# Patient Record
Sex: Female | Born: 1960 | Race: White | Hispanic: No | Smoking: Current every day smoker
Health system: Southern US, Community
[De-identification: ages and names within clinical notes are randomized; demographics above are authoritative.]

## PROBLEM LIST (undated history)

## (undated) DIAGNOSIS — S161XXA Strain of muscle, fascia and tendon at neck level, initial encounter: Secondary | ICD-10-CM

## (undated) DIAGNOSIS — S0990XA Unspecified injury of head, initial encounter: Secondary | ICD-10-CM

## (undated) DIAGNOSIS — E876 Hypokalemia: Secondary | ICD-10-CM

## (undated) DIAGNOSIS — I10 Essential (primary) hypertension: Secondary | ICD-10-CM

## (undated) DIAGNOSIS — M79604 Pain in right leg: Secondary | ICD-10-CM

## (undated) DIAGNOSIS — E871 Hypo-osmolality and hyponatremia: Secondary | ICD-10-CM

## (undated) DIAGNOSIS — R112 Nausea with vomiting, unspecified: Secondary | ICD-10-CM

## (undated) DIAGNOSIS — R1030 Lower abdominal pain, unspecified: Secondary | ICD-10-CM

---

## 2001-04-08 ENCOUNTER — Other Ambulatory Visit: Admission: RE | Admit: 2001-04-08 | Discharge: 2001-04-08 | Payer: Self-pay | Admitting: Gynecology

## 2002-05-19 ENCOUNTER — Other Ambulatory Visit: Admission: RE | Admit: 2002-05-19 | Discharge: 2002-05-19 | Payer: Self-pay | Admitting: Gynecology

## 2003-10-05 ENCOUNTER — Other Ambulatory Visit: Admission: RE | Admit: 2003-10-05 | Discharge: 2003-10-05 | Payer: Self-pay | Admitting: Gynecology

## 2004-07-16 ENCOUNTER — Other Ambulatory Visit: Admission: RE | Admit: 2004-07-16 | Discharge: 2004-07-16 | Payer: Self-pay | Admitting: Gynecology

## 2004-11-08 ENCOUNTER — Other Ambulatory Visit: Admission: RE | Admit: 2004-11-08 | Discharge: 2004-11-08 | Payer: Self-pay | Admitting: Gynecology

## 2005-02-12 ENCOUNTER — Other Ambulatory Visit: Admission: RE | Admit: 2005-02-12 | Discharge: 2005-02-12 | Payer: Self-pay | Admitting: Gynecology

## 2005-02-22 ENCOUNTER — Encounter: Admission: RE | Admit: 2005-02-22 | Discharge: 2005-02-22 | Payer: Self-pay | Admitting: Orthopedic Surgery

## 2005-03-14 ENCOUNTER — Inpatient Hospital Stay (HOSPITAL_COMMUNITY): Admission: RE | Admit: 2005-03-14 | Discharge: 2005-03-16 | Payer: Self-pay | Admitting: Orthopedic Surgery

## 2018-08-24 ENCOUNTER — Encounter (HOSPITAL_COMMUNITY): Payer: Self-pay

## 2018-08-24 ENCOUNTER — Inpatient Hospital Stay (HOSPITAL_COMMUNITY)
Admission: EM | Admit: 2018-08-24 | Discharge: 2018-09-04 | DRG: 982 | Disposition: A | Discharge status: Skilled Nursing Facility | Payer: Self-pay | Attending: Internal Medicine | Admitting: Internal Medicine

## 2018-08-24 ENCOUNTER — Emergency Department (HOSPITAL_COMMUNITY): Payer: Self-pay

## 2018-08-24 ENCOUNTER — Other Ambulatory Visit: Payer: Self-pay

## 2018-08-24 DIAGNOSIS — R159 Full incontinence of feces: Secondary | ICD-10-CM | POA: Diagnosis not present

## 2018-08-24 DIAGNOSIS — M79604 Pain in right leg: Secondary | ICD-10-CM

## 2018-08-24 DIAGNOSIS — E872 Acidosis: Secondary | ICD-10-CM | POA: Diagnosis present

## 2018-08-24 DIAGNOSIS — Z808 Family history of malignant neoplasm of other organs or systems: Secondary | ICD-10-CM

## 2018-08-24 DIAGNOSIS — X58XXXA Exposure to other specified factors, initial encounter: Secondary | ICD-10-CM | POA: Diagnosis not present

## 2018-08-24 DIAGNOSIS — L8990 Pressure ulcer of unspecified site, unspecified stage: Secondary | ICD-10-CM

## 2018-08-24 DIAGNOSIS — I10 Essential (primary) hypertension: Secondary | ICD-10-CM

## 2018-08-24 DIAGNOSIS — L89151 Pressure ulcer of sacral region, stage 1: Secondary | ICD-10-CM | POA: Diagnosis present

## 2018-08-24 DIAGNOSIS — W19XXXA Unspecified fall, initial encounter: Secondary | ICD-10-CM

## 2018-08-24 DIAGNOSIS — R52 Pain, unspecified: Secondary | ICD-10-CM

## 2018-08-24 DIAGNOSIS — Z801 Family history of malignant neoplasm of trachea, bronchus and lung: Secondary | ICD-10-CM

## 2018-08-24 DIAGNOSIS — A084 Viral intestinal infection, unspecified: Secondary | ICD-10-CM | POA: Diagnosis present

## 2018-08-24 DIAGNOSIS — R9431 Abnormal electrocardiogram [ECG] [EKG]: Secondary | ICD-10-CM | POA: Diagnosis present

## 2018-08-24 DIAGNOSIS — E876 Hypokalemia: Secondary | ICD-10-CM

## 2018-08-24 DIAGNOSIS — I952 Hypotension due to drugs: Secondary | ICD-10-CM | POA: Diagnosis not present

## 2018-08-24 DIAGNOSIS — Z751 Person awaiting admission to adequate facility elsewhere: Secondary | ICD-10-CM

## 2018-08-24 DIAGNOSIS — Z884 Allergy status to anesthetic agent status: Secondary | ICD-10-CM

## 2018-08-24 DIAGNOSIS — G8918 Other acute postprocedural pain: Secondary | ICD-10-CM | POA: Diagnosis not present

## 2018-08-24 DIAGNOSIS — N838 Other noninflammatory disorders of ovary, fallopian tube and broad ligament: Secondary | ICD-10-CM

## 2018-08-24 DIAGNOSIS — K0889 Other specified disorders of teeth and supporting structures: Secondary | ICD-10-CM

## 2018-08-24 DIAGNOSIS — Z85828 Personal history of other malignant neoplasm of skin: Secondary | ICD-10-CM

## 2018-08-24 DIAGNOSIS — R7989 Other specified abnormal findings of blood chemistry: Secondary | ICD-10-CM

## 2018-08-24 DIAGNOSIS — S0990XA Unspecified injury of head, initial encounter: Secondary | ICD-10-CM

## 2018-08-24 DIAGNOSIS — G4700 Insomnia, unspecified: Secondary | ICD-10-CM | POA: Diagnosis not present

## 2018-08-24 DIAGNOSIS — F4325 Adjustment disorder with mixed disturbance of emotions and conduct: Secondary | ICD-10-CM | POA: Diagnosis present

## 2018-08-24 DIAGNOSIS — S8261XA Displaced fracture of lateral malleolus of right fibula, initial encounter for closed fracture: Secondary | ICD-10-CM

## 2018-08-24 DIAGNOSIS — R634 Abnormal weight loss: Secondary | ICD-10-CM | POA: Diagnosis present

## 2018-08-24 DIAGNOSIS — S93431A Sprain of tibiofibular ligament of right ankle, initial encounter: Secondary | ICD-10-CM | POA: Diagnosis present

## 2018-08-24 DIAGNOSIS — F17210 Nicotine dependence, cigarettes, uncomplicated: Secondary | ICD-10-CM | POA: Diagnosis present

## 2018-08-24 DIAGNOSIS — Z96651 Presence of right artificial knee joint: Secondary | ICD-10-CM | POA: Diagnosis present

## 2018-08-24 DIAGNOSIS — R410 Disorientation, unspecified: Secondary | ICD-10-CM

## 2018-08-24 DIAGNOSIS — R1030 Lower abdominal pain, unspecified: Secondary | ICD-10-CM

## 2018-08-24 DIAGNOSIS — E860 Dehydration: Secondary | ICD-10-CM | POA: Diagnosis present

## 2018-08-24 DIAGNOSIS — S82831A Other fracture of upper and lower end of right fibula, initial encounter for closed fracture: Secondary | ICD-10-CM

## 2018-08-24 DIAGNOSIS — W1830XA Fall on same level, unspecified, initial encounter: Secondary | ICD-10-CM | POA: Diagnosis present

## 2018-08-24 DIAGNOSIS — E878 Other disorders of electrolyte and fluid balance, not elsewhere classified: Secondary | ICD-10-CM

## 2018-08-24 DIAGNOSIS — G3184 Mild cognitive impairment, so stated: Secondary | ICD-10-CM | POA: Diagnosis present

## 2018-08-24 DIAGNOSIS — I959 Hypotension, unspecified: Secondary | ICD-10-CM

## 2018-08-24 DIAGNOSIS — E871 Hypo-osmolality and hyponatremia: Principal | ICD-10-CM

## 2018-08-24 DIAGNOSIS — R112 Nausea with vomiting, unspecified: Secondary | ICD-10-CM

## 2018-08-24 DIAGNOSIS — N179 Acute kidney failure, unspecified: Secondary | ICD-10-CM | POA: Diagnosis present

## 2018-08-24 DIAGNOSIS — R456 Violent behavior: Secondary | ICD-10-CM | POA: Diagnosis not present

## 2018-08-24 DIAGNOSIS — F329 Major depressive disorder, single episode, unspecified: Secondary | ICD-10-CM

## 2018-08-24 DIAGNOSIS — Z85820 Personal history of malignant melanoma of skin: Secondary | ICD-10-CM

## 2018-08-24 DIAGNOSIS — Y92009 Unspecified place in unspecified non-institutional (private) residence as the place of occurrence of the external cause: Secondary | ICD-10-CM

## 2018-08-24 DIAGNOSIS — Z79899 Other long term (current) drug therapy: Secondary | ICD-10-CM

## 2018-08-24 DIAGNOSIS — F411 Generalized anxiety disorder: Secondary | ICD-10-CM

## 2018-08-24 DIAGNOSIS — E861 Hypovolemia: Secondary | ICD-10-CM | POA: Diagnosis present

## 2018-08-24 DIAGNOSIS — M810 Age-related osteoporosis without current pathological fracture: Secondary | ICD-10-CM | POA: Diagnosis present

## 2018-08-24 DIAGNOSIS — S161XXA Strain of muscle, fascia and tendon at neck level, initial encounter: Secondary | ICD-10-CM

## 2018-08-24 DIAGNOSIS — M1990 Unspecified osteoarthritis, unspecified site: Secondary | ICD-10-CM

## 2018-08-24 DIAGNOSIS — T424X5A Adverse effect of benzodiazepines, initial encounter: Secondary | ICD-10-CM | POA: Diagnosis not present

## 2018-08-24 HISTORY — DX: Strain of muscle, fascia and tendon at neck level, initial encounter: S16.1XXA

## 2018-08-24 HISTORY — DX: Hypo-osmolality and hyponatremia: E87.1

## 2018-08-24 HISTORY — DX: Lower abdominal pain, unspecified: R10.30

## 2018-08-24 HISTORY — DX: Hypokalemia: E87.6

## 2018-08-24 HISTORY — DX: Essential (primary) hypertension: I10

## 2018-08-24 HISTORY — DX: Nausea with vomiting, unspecified: R11.2

## 2018-08-24 HISTORY — DX: Unspecified injury of head, initial encounter: S09.90XA

## 2018-08-24 HISTORY — DX: Pain in right leg: M79.604

## 2018-08-24 LAB — MRSA PCR SCREENING: MRSA BY PCR: NEGATIVE

## 2018-08-24 LAB — CBC
HCT: 46.3 % — ABNORMAL HIGH (ref 36.0–46.0)
HEMATOCRIT: 35 % — AB (ref 36.0–46.0)
Hemoglobin: 17.1 g/dL — ABNORMAL HIGH (ref 12.0–15.0)
Hemoglobin: 13.2 g/dL (ref 12.0–15.0)
MCH: 35.3 pg — ABNORMAL HIGH (ref 26.0–34.0)
MCH: 35.6 pg — ABNORMAL HIGH (ref 26.0–34.0)
MCHC: 36.9 g/dL — AB (ref 30.0–36.0)
MCHC: 37.7 g/dL — ABNORMAL HIGH (ref 30.0–36.0)
MCV: 95.7 fL (ref 78.0–100.0)
MCV: 94.3 fL (ref 78.0–100.0)
PLATELETS: 337 10*3/uL (ref 150–400)
PLATELETS: 213 10*3/uL (ref 150–400)
RBC: 4.84 MIL/uL (ref 3.87–5.11)
RBC: 3.71 MIL/uL — ABNORMAL LOW (ref 3.87–5.11)
RDW: 11.3 % — AB (ref 11.5–15.5)
RDW: 11.3 % — AB (ref 11.5–15.5)
WBC: 10.1 10*3/uL (ref 4.0–10.5)
WBC: 6.4 10*3/uL (ref 4.0–10.5)

## 2018-08-24 LAB — COMPREHENSIVE METABOLIC PANEL
ALBUMIN: 4.2 g/dL (ref 3.5–5.0)
ALK PHOS: 79 U/L (ref 38–126)
ALT: 40 U/L (ref 0–44)
ALT: 33 U/L (ref 0–44)
ANION GAP: 24 — AB (ref 5–15)
AST: 47 U/L — AB (ref 15–41)
AST: 34 U/L (ref 15–41)
Albumin: 3.5 g/dL (ref 3.5–5.0)
Alkaline Phosphatase: 70 U/L (ref 38–126)
BILIRUBIN TOTAL: 3.3 mg/dL — AB (ref 0.3–1.2)
BUN: 24 mg/dL — AB (ref 6–20)
BUN: 21 mg/dL — AB (ref 6–20)
CHLORIDE: 68 mmol/L — AB (ref 98–111)
CO2: 29 mmol/L (ref 22–32)
CO2: 32 mmol/L (ref 22–32)
CREATININE: 1.57 mg/dL — AB (ref 0.44–1.00)
Calcium: 10.1 mg/dL (ref 8.9–10.3)
Calcium: 9 mg/dL (ref 8.9–10.3)
Chloride: <65 mmol/L — CL (ref 98–111)
Creatinine, Ser: 1.27 mg/dL — ABNORMAL HIGH (ref 0.44–1.00)
GFR calc Af Amer: 41 mL/min — ABNORMAL LOW (ref >60)
GFR, EST AFRICAN AMERICAN: 53 mL/min — AB (ref >60)
GFR, EST NON AFRICAN AMERICAN: 36 mL/min — AB (ref >60)
GFR, EST NON AFRICAN AMERICAN: 46 mL/min — AB (ref >60)
GLUCOSE: 109 mg/dL — AB (ref 70–99)
Glucose, Bld: 85 mg/dL (ref 70–99)
Potassium: 2.2 mmol/L — CL (ref 3.5–5.1)
Potassium: 2.3 mmol/L — CL (ref 3.5–5.1)
Sodium: 122 mmol/L — ABNORMAL LOW (ref 135–145)
Sodium: 124 mmol/L — ABNORMAL LOW (ref 135–145)
TOTAL PROTEIN: 7.8 g/dL (ref 6.5–8.1)
TOTAL PROTEIN: 6.6 g/dL (ref 6.5–8.1)
Total Bilirubin: 3.7 mg/dL — ABNORMAL HIGH (ref 0.3–1.2)

## 2018-08-24 LAB — BASIC METABOLIC PANEL
Anion gap: 24 — ABNORMAL HIGH (ref 5–15)
BUN: 20 mg/dL (ref 6–20)
CO2: 31 mmol/L (ref 22–32)
Calcium: 8.9 mg/dL (ref 8.9–10.3)
Chloride: 70 mmol/L — ABNORMAL LOW (ref 98–111)
Creatinine, Ser: 1.14 mg/dL — ABNORMAL HIGH (ref 0.44–1.00)
GFR, EST NON AFRICAN AMERICAN: 52 mL/min — AB (ref >60)
Glucose, Bld: 75 mg/dL (ref 70–99)
Potassium: 2.4 mmol/L — CL (ref 3.5–5.1)
SODIUM: 125 mmol/L — AB (ref 135–145)

## 2018-08-24 LAB — I-STAT BETA HCG BLOOD, ED (MC, WL, AP ONLY): I-stat hCG, quantitative: 15.4 m[IU]/mL — ABNORMAL HIGH (ref <5)

## 2018-08-24 LAB — LIPASE, BLOOD: Lipase: 25 U/L (ref 11–51)

## 2018-08-24 LAB — MAGNESIUM: MAGNESIUM: 2.3 mg/dL (ref 1.7–2.4)

## 2018-08-24 MED ORDER — ONDANSETRON 4 MG PO TBDP: 4.0000 mg | ORAL_TABLET | Freq: Once | ORAL | Status: DC | PRN

## 2018-08-24 MED ORDER — IOHEXOL 300 MG/ML  SOLN
100.0000 mL | Freq: Once | INTRAMUSCULAR | Status: AC | PRN
  Administered 2018-08-24: 75 mL via INTRAVENOUS

## 2018-08-24 MED ORDER — METOCLOPRAMIDE HCL 5 MG/ML IJ SOLN
5.0000 mg | Freq: Once | INTRAMUSCULAR | Status: AC
  Administered 2018-08-24: 5 mg via INTRAVENOUS
  Filled 2018-08-24: qty 2

## 2018-08-24 MED ORDER — SODIUM CHLORIDE 0.9 % IV SOLN
INTRAVENOUS | Status: DC
  Administered 2018-08-24 – 2018-08-26 (×4): via INTRAVENOUS

## 2018-08-24 MED ORDER — LACTATED RINGERS IV BOLUS
500.0000 mL | Freq: Once | INTRAVENOUS | Status: AC
  Administered 2018-08-24: 500 mL via INTRAVENOUS

## 2018-08-24 MED ORDER — SODIUM CHLORIDE 0.9 % IV SOLN: 250.0000 mL | INTRAVENOUS | Status: DC | PRN

## 2018-08-24 MED ORDER — SODIUM CHLORIDE 0.9% FLUSH: 3.0000 mL | INTRAVENOUS | Status: DC | PRN

## 2018-08-24 MED ORDER — POTASSIUM CHLORIDE 10 MEQ/100ML IV SOLN
10.0000 meq | INTRAVENOUS | Status: AC
  Administered 2018-08-25 (×3): 10 meq via INTRAVENOUS
  Filled 2018-08-24 (×4): qty 100

## 2018-08-24 MED ORDER — POTASSIUM CHLORIDE CRYS ER 20 MEQ PO TBCR
40.0000 meq | EXTENDED_RELEASE_TABLET | Freq: Every day | ORAL | Status: DC
  Administered 2018-08-24: 40 meq via ORAL
  Filled 2018-08-24: qty 2

## 2018-08-24 MED ORDER — SODIUM CHLORIDE 0.9% FLUSH: 3.0000 mL | Freq: Two times a day (BID) | INTRAVENOUS | Status: DC

## 2018-08-24 MED ORDER — MORPHINE SULFATE (PF) 4 MG/ML IV SOLN
4.0000 mg | Freq: Once | INTRAVENOUS | Status: AC
  Administered 2018-08-24: 4 mg via INTRAVENOUS
  Filled 2018-08-24: qty 1

## 2018-08-24 MED ORDER — POTASSIUM CHLORIDE 10 MEQ/100ML IV SOLN
10.0000 meq | INTRAVENOUS | Status: AC
  Administered 2018-08-24 (×6): 10 meq via INTRAVENOUS
  Filled 2018-08-24 (×6): qty 100

## 2018-08-24 MED ORDER — SODIUM CHLORIDE 0.9 % IV BOLUS
500.0000 mL | Freq: Once | INTRAVENOUS | Status: AC
  Administered 2018-08-24: 500 mL via INTRAVENOUS

## 2018-08-24 MED ORDER — ONDANSETRON HCL 4 MG/2ML IJ SOLN: 4.0000 mg | Freq: Once | INTRAMUSCULAR | Status: DC

## 2018-08-24 MED ORDER — ENOXAPARIN SODIUM 40 MG/0.4ML ~~LOC~~ SOLN
40.0000 mg | SUBCUTANEOUS | Status: DC
  Administered 2018-08-24 – 2018-08-26 (×3): 40 mg via SUBCUTANEOUS
  Filled 2018-08-24 (×3): qty 0.4

## 2018-08-24 MED ORDER — INFLUENZA VAC SPLIT QUAD 0.5 ML IM SUSY
0.5000 mL | PREFILLED_SYRINGE | INTRAMUSCULAR | Status: DC
  Filled 2018-08-24: qty 0.5

## 2018-08-24 MED ORDER — ACETAMINOPHEN 325 MG PO TABS
650.0000 mg | ORAL_TABLET | Freq: Four times a day (QID) | ORAL | Status: DC | PRN
  Administered 2018-08-24 – 2018-08-25 (×2): 650 mg via ORAL
  Filled 2018-08-24 (×2): qty 2

## 2018-08-24 NOTE — ED Notes (Signed)
Patient transported to CT 

## 2018-08-24 NOTE — Progress Notes (Signed)
Paged for patient being hypotensive with MAP of 57 although she has been asymptomatic. HR 81, 99% O2 RA. On visiting patient at bedside she denies acute symptoms of chills, feeling feversih, dizziness, nausea, dysuria, SOB or recent cough and is feeling much better compared to earlier today. She states earlier in the day she was urinating a lot but has not urinated in the past three hours. She has a purewick in place. She states she is hungry but taking it slow in eating.   Plan:  - strict I/O's - bladder scan  - 500cc bolus LR    Seawell, Jaimie A, DO 08/24/2018, 10:28 PM Pager: [phone]

## 2018-08-24 NOTE — ED Notes (Signed)
Sonya Cohen (ex husband)  Contact info: [phone]  * call for any changes*

## 2018-08-24 NOTE — ED Provider Notes (Signed)
Emergency Department Provider Note   I have reviewed the triage vital signs and the nursing notes.   HISTORY  Chief Complaint Fall; Hip Pain; Ankle Pain; and Diarrhea   HPI Sonya Cohen is a 57 y.o. female with PMH of HTN [place] to the emergency department with multiple complaints including the right lower extremity pain after fall this morning.  She has had nausea and vomiting for the past 5 days.  Is developed generalized weakness which she believes led to her fall today.  She is also experiencing some generalized abdominal discomfort which is slightly worse on the right side.  Her fall today may have involved some head injury according to the patient.  Loss of consciousness.  No chest pain or shortness of breath prior to falling or afterwards.  No heart palpitations symptoms.  Denies any new medications.    Past Medical History:  Diagnosis Date  . Hypertension     Patient Active Problem List   Diagnosis Date Noted  . Hyponatremia 08/24/2018  . Hypokalemia   . Non-intractable vomiting with nausea   . Lower abdominal pain   . Closed fracture of right distal fibula   . Right leg pain   . Head injury   . Cervical strain     History reviewed. No pertinent surgical history.    Allergies Anesthetics, ester  History reviewed. No pertinent family history.  Social History Social History   Tobacco Use  . Smoking status: Never Smoker  . Smokeless tobacco: Never Used  Substance Use Topics  . Alcohol use: Never    Frequency: Never  . Drug use: Never    Review of Systems  Constitutional: No fever/chills. Positive generalized weakness.  Eyes: No visual changes. ENT: No sore throat. Cardiovascular: Denies chest pain. Respiratory: Denies shortness of breath. Gastrointestinal: Positive abdominal pain. Positive nausea and vomiting.  No diarrhea.  No constipation. Genitourinary: Negative for dysuria. Musculoskeletal: Positive right hip, knee, and ankle pain.    Skin: Negative for rash. Neurological: Negative for headaches, focal weakness or numbness.  10-point ROS otherwise negative.  ____________________________________________   PHYSICAL EXAM:  VITAL SIGNS: ED Triage Vitals  Enc Vitals Group     BP 08/24/18 1113 95/60     Pulse Rate 08/24/18 1113 78     Resp 08/24/18 1113 (!) 22     Temp 08/24/18 1113 98.6 F (37 C)     Temp Source 08/24/18 1113 Oral     SpO2 08/24/18 1110 100 %     Weight 08/24/18 1225 140 lb (63.5 kg)     Height 08/24/18 1225 5\' 2"  (1.575 m)     Pain Score 08/24/18 1111 7   Constitutional: Alert and oriented. Well appearing and in no acute distress. Eyes: Conjunctivae are normal. PERRL. Head: Atraumatic. Nose: No congestion/rhinnorhea. Mouth/Throat: Mucous membranes are dry.  Neck: No stridor. No cervical spine tenderness to palpation. Cardiovascular: Normal rate, regular rhythm. Good peripheral circulation. Grossly normal heart sounds.   Respiratory: Normal respiratory effort.  No retractions. Lungs CTAB. Gastrointestinal: Soft with mild diffuse tenderness worse in the lower abdomen. No distention.  Musculoskeletal: Right hip, knee, and ankle tenderness to passive ROM. Bruising and swelling over the right ankle. Palpable DP/PT pulses. Normal sensation in the LEs.  Neurologic:  Normal speech and language. No gross focal neurologic deficits are appreciated.  Skin:  Skin is warm, dry and intact. No rash noted.   ____________________________________________   LABS (all labs ordered are listed, but only abnormal results are  displayed)  Labs Reviewed  COMPREHENSIVE METABOLIC PANEL - Abnormal; Notable for the following components:      Result Value   Sodium 122 (*)    Potassium 2.2 (*)    Chloride <65 (*)    Glucose, Bld 109 (*)    BUN 24 (*)    Creatinine, Ser 1.57 (*)    AST 47 (*)    Total Bilirubin 3.7 (*)    GFR calc non Af Amer 36 (*)    GFR calc Af Amer 41 (*)    All other components within  normal limits  CBC - Abnormal; Notable for the following components:   Hemoglobin 17.1 (*)    HCT 46.3 (*)    MCH 35.3 (*)    MCHC 36.9 (*)    RDW 11.3 (*)    All other components within normal limits  COMPREHENSIVE METABOLIC PANEL - Abnormal; Notable for the following components:   Sodium 124 (*)    Potassium 2.3 (*)    Chloride 68 (*)    BUN 21 (*)    Creatinine, Ser 1.27 (*)    Total Bilirubin 3.3 (*)    GFR calc non Af Amer 46 (*)    GFR calc Af Amer 53 (*)    Anion gap 24 (*)    All other components within normal limits  I-STAT BETA HCG BLOOD, ED ([facility], [facility], [facility] ONLY) - Abnormal; Notable for the following components:   I-stat hCG, quantitative 15.4 (*)    All other components within normal limits  MRSA PCR SCREENING  LIPASE, BLOOD  MAGNESIUM  URINALYSIS, ROUTINE W REFLEX MICROSCOPIC  HIV ANTIBODY (ROUTINE TESTING W REFLEX)  CBC  BASIC METABOLIC PANEL  BASIC METABOLIC PANEL  BASIC METABOLIC PANEL  LACTATE DEHYDROGENASE  AFP TUMOR MARKER  BASIC METABOLIC PANEL   ____________________________________________  EKG   EKG Interpretation  Date/Time:  Monday August 24 2018 11:22:30 EDT Ventricular Rate:  95 PR Interval:  148 QRS Duration: 90 QT Interval:  432 QTC Calculation: 542 R Axis:   40 Text Interpretation:  Normal sinus rhythm Right atrial enlargement Possible Lateral infarct , age undetermined Inferior infarct , age undetermined Prolonged QT Abnormal ECG No STEMI.  Confirmed by Alona BeneLong, Joshua [phone]([id number]) on 08/24/2018 12:51:40 PM       ____________________________________________  RADIOLOGY  Dg Knee 2 Views Right  Result Date: 08/24/2018 CLINICAL DATA:  Fall 2 days ago, RIGHT hip and ankle pain. EXAM: RIGHT KNEE - 1-2 VIEW COMPARISON:  Plain film of the RIGHT knee dated 03/26/2018. FINDINGS: RIGHT knee arthroplasty hardware appears intact and stable in position. No osseous fracture line or acutely displaced fracture fragment seen. No appreciable joint effusion  and adjacent soft tissues are unremarkable. IMPRESSION: No acute findings. RIGHT knee arthroplasty hardware appears intact and stable in position. Electronically Signed   By: Bary RichardStan  Maynard M.D.   On: 08/24/2018 14:41   Dg Ankle Complete Right  Result Date: 08/24/2018 CLINICAL DATA:  Fall 2 days ago, RIGHT hip and ankle pain. EXAM: RIGHT ANKLE - COMPLETE 3+ VIEW COMPARISON:  None. FINDINGS: Slightly displaced obliquely oriented fracture of the distal RIGHT fibula, with approximately 3 mm cortical displacement. Associated soft tissue swelling about the RIGHT ankle, most prominent over the lateral malleolus. Distal tibia appears intact and normally aligned. Ankle mortise appeared grossly symmetric on the oblique view. Visualized portions of the hindfoot and midfoot appear intact. IMPRESSION: Slightly displaced oblique fracture of the distal RIGHT fibula, with associated soft tissue swelling. Electronically Signed  By: Bary Richard M.D.   On: 08/24/2018 14:44   Ct Head Wo Contrast  Result Date: 08/24/2018 CLINICAL DATA:  Fall 2 days ago with head and neck pain. EXAM: CT HEAD WITHOUT CONTRAST CT CERVICAL SPINE WITHOUT CONTRAST TECHNIQUE: Multidetector CT imaging of the head and cervical spine was performed following the standard protocol without intravenous contrast. Multiplanar CT image reconstructions of the cervical spine were also generated. COMPARISON:  None. FINDINGS: CT HEAD FINDINGS Brain: No evidence of acute infarction, hemorrhage, hydrocephalus, extra-axial collection or mass lesion/mass effect. Vascular: No hyperdense vessel or unexpected calcification. Skull: Normal. Negative for fracture or focal lesion. Sinuses/Orbits: Orbits are normal. Paranasal sinuses are well developed as there is minimal opacification over the ethmoid air cells. Mastoid air cells are clear. Other: None. CT CERVICAL SPINE FINDINGS Alignment: Within normal. Skull base and vertebrae: Non fusion of the posterior arch of C1.  Mild spondylosis throughout the cervical spine. Anterior fusion hardware intact from C5-C7. Uncovertebral joint spurring and facet arthropathy. Partial fusion of the C3-4 facet joints. No foraminal narrowing at multiple levels due to adjacent bony spurring. No acute fracture or subluxation. Soft tissues and spinal canal: No prevertebral fluid or swelling. No visible canal hematoma. Disc levels:  Subtle disc space narrowing at the C3-4 C4-5 levels. Upper chest: No acute findings. Other: None. IMPRESSION: No acute brain injury. No acute cervical spine injury. Mild spondylosis of the cervical spine with minimal disc disease at the C3-4 and C4-5 levels. Moderate multilevel bilateral neural foraminal narrowing due to adjacent bony spurring. Anterior fusion hardware intact from C5-C7. Subtle chronic sinus inflammatory change over the ethmoid air cells. Electronically Signed   By: Elberta Fortis M.D.   On: 08/24/2018 14:29   Ct Cervical Spine Wo Contrast  Result Date: 08/24/2018 CLINICAL DATA:  Fall 2 days ago with head and neck pain. EXAM: CT HEAD WITHOUT CONTRAST CT CERVICAL SPINE WITHOUT CONTRAST TECHNIQUE: Multidetector CT imaging of the head and cervical spine was performed following the standard protocol without intravenous contrast. Multiplanar CT image reconstructions of the cervical spine were also generated. COMPARISON:  None. FINDINGS: CT HEAD FINDINGS Brain: No evidence of acute infarction, hemorrhage, hydrocephalus, extra-axial collection or mass lesion/mass effect. Vascular: No hyperdense vessel or unexpected calcification. Skull: Normal. Negative for fracture or focal lesion. Sinuses/Orbits: Orbits are normal. Paranasal sinuses are well developed as there is minimal opacification over the ethmoid air cells. Mastoid air cells are clear. Other: None. CT CERVICAL SPINE FINDINGS Alignment: Within normal. Skull base and vertebrae: Non fusion of the posterior arch of C1. Mild spondylosis throughout the cervical  spine. Anterior fusion hardware intact from C5-C7. Uncovertebral joint spurring and facet arthropathy. Partial fusion of the C3-4 facet joints. No foraminal narrowing at multiple levels due to adjacent bony spurring. No acute fracture or subluxation. Soft tissues and spinal canal: No prevertebral fluid or swelling. No visible canal hematoma. Disc levels:  Subtle disc space narrowing at the C3-4 C4-5 levels. Upper chest: No acute findings. Other: None. IMPRESSION: No acute brain injury. No acute cervical spine injury. Mild spondylosis of the cervical spine with minimal disc disease at the C3-4 and C4-5 levels. Moderate multilevel bilateral neural foraminal narrowing due to adjacent bony spurring. Anterior fusion hardware intact from C5-C7. Subtle chronic sinus inflammatory change over the ethmoid air cells. Electronically Signed   By: Elberta Fortis M.D.   On: 08/24/2018 14:29   Ct Abdomen Pelvis W Contrast  Result Date: 08/24/2018 CLINICAL DATA:  Nausea,vomiting and diarrhea With generalized  weakness for 2 days. Reduced dose. EXAM: CT ABDOMEN AND PELVIS WITH CONTRAST TECHNIQUE: Multidetector CT imaging of the abdomen and pelvis was performed using the standard protocol following bolus administration of intravenous contrast. CONTRAST:  75mL OMNIPAQUE IOHEXOL 300 MG/ML  SOLN COMPARISON:  None. FINDINGS: Lower chest: Lung bases are clear. Hepatobiliary: Gallbladder is distended. Otherwise gallbladder is normal in appearance. Liver is homogeneous without focal mass. Pancreas: Unremarkable. No pancreatic ductal dilatation or surrounding inflammatory changes. Spleen: Normal in size without focal abnormality. Adrenals/Urinary Tract: Adrenal glands are unremarkable. Kidneys are normal, without renal calculi, focal lesion, or hydronephrosis. Bladder is unremarkable. Stomach/Bowel: Small hiatal hernia. The stomach is otherwise normal in appearance. Normal appearance of small bowel loops. There is fat deposition within the  wall of the ascending and proximal transverse colon consistent with chronic inflammatory change. The appendix is well seen and has a normal appearance. Vascular/Lymphatic: There is atherosclerotic calcification of the abdominal aorta not associated with aneurysm. Reproductive: Uterus is present. Coarse calcification is identified in the RIGHT adnexal region, raising the question of a possible dermoid. Ovary is not enlarged. Other: No abdominal wall hernia or abnormality. No abdominopelvic ascites. Musculoskeletal: Superior endplate fracture of L3 is favored to be chronic. No suspicious lytic or blastic lesions. IMPRESSION: 1. Fat deposition within the wall of the ascending and proximal transverse colon is consistent with chronic inflammatory changes. No evidence for acute inflammation/obstruction. 2. 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 3. Small hiatal hernia. 4. Normal appendix. 5. Chronic superior endplate fracture of L3. Electronically Signed   By: Norva Pavlov M.D.   On: 08/24/2018 14:31   Dg Hip Unilat W Or Wo Pelvis 2-3 Views Right  Result Date: 08/24/2018 CLINICAL DATA:  Mechanical fall 2 days ago, RIGHT hip and ankle pain since. EXAM: DG HIP (WITH OR WITHOUT PELVIS) 2-3V RIGHT COMPARISON:  None. FINDINGS: There is no evidence of hip fracture or dislocation. There is no evidence of arthropathy or other focal bone abnormality. IMPRESSION: Negative. Electronically Signed   By: Bary Richard M.D.   On: 08/24/2018 14:40    ____________________________________________   PROCEDURES  Procedure(s) performed:   .Critical Care Performed by: Maia Plan, MD Authorized by: Maia Plan, MD   Critical care provider statement:    Critical care time (minutes):  35   Critical care time was exclusive of:  Separately billable procedures and treating other patients and teaching time   Critical care was necessary to treat or prevent imminent or  life-threatening deterioration of the following conditions:  Dehydration and metabolic crisis   Critical care was time spent personally by me on the following activities:  Blood draw for specimens, development of treatment plan with patient or surrogate, evaluation of patient's response to treatment, examination of patient, obtaining history from patient or surrogate, ordering and performing treatments and interventions, ordering and review of laboratory studies, ordering and review of radiographic studies, pulse oximetry, re-evaluation of patient's condition and review of old charts   I assumed direction of critical care for this patient from another provider in my specialty: no       ____________________________________________   INITIAL IMPRESSION / ASSESSMENT AND PLAN / ED COURSE  Pertinent labs & imaging results that were available during my care of the patient were reviewed by me and considered in my medical decision making (see chart for details).  Presents to the emergency department with 5 days of worsening generalized weakness in the setting of nausea and  vomiting.  She has abdominal pain with tenderness worse in the lower quadrants.  No peritoneal findings.  Patient's weakness caused her to have a fall today which have resulted in pain in the right leg.  No midline thoracic or lumbar tenderness.  The lower extremities are neurovascularly intact.  Plan for plain films of the right hip, knee, ankle.  Also plan for CT imaging of the head, cervical spine, abdomen.  The patient has significant hypokalemia with only slightly prolonged QT on EKG.  Plan for replacement of this.  Patient also with significant hyponatremia.  Plan for normal saline at 150/h and will follow imaging.  Does note says the patient had a fall 2 days ago but she reports this fall was this morning to me.   Patient with distal fibular fracture. Paged ortho but no response. Will defer to primary team. Patient with significant  hypokalemia and hyponatremia. Plan for admit for further treatment and evaluation. CT imaging reviewed with no acute findings.   Discussed patient's case with Medicine Service to request admission. Patient and family (if present) updated with plan. Care transferred to Medicine Service.  I reviewed all nursing notes, vitals, pertinent old records, EKGs, labs, imaging (as available).  ____________________________________________  FINAL CLINICAL IMPRESSION(S) / ED DIAGNOSES  Final diagnoses:  Hyponatremia  Hypokalemia  Non-intractable vomiting with nausea, unspecified vomiting type  Lower abdominal pain  Closed fracture of distal end of right fibula, unspecified fracture morphology, initial encounter  Right leg pain  Injury of head, initial encounter  Strain of neck muscle, initial encounter     MEDICATIONS GIVEN DURING THIS VISIT:  Medications  0.9 %  sodium chloride infusion ( Intravenous Rate/Dose Change 08/24/18 1842)  enoxaparin (LOVENOX) injection 40 mg (40 mg Subcutaneous Given 08/24/18 2011)  Influenza vac split quadrivalent PF (FLUARIX) injection 0.5 mL (has no administration in time range)  potassium chloride SA (K-DUR,KLOR-CON) CR tablet 40 mEq (40 mEq Oral Given 08/24/18 2200)  acetaminophen (TYLENOL) tablet 650 mg (650 mg Oral Given 08/24/18 2159)  lactated ringers bolus 500 mL (has no administration in time range)  potassium chloride 10 mEq in 100 mL IVPB (10 mEq Intravenous New Bag/Given 08/24/18 2010)  iohexol (OMNIPAQUE) 300 MG/ML solution 100 mL (75 mLs Intravenous Contrast Given 08/24/18 1357)  morphine 4 MG/ML injection 4 mg (4 mg Intravenous Given 08/24/18 1453)  metoCLOPramide (REGLAN) injection 5 mg (5 mg Intravenous Given 08/24/18 1503)  sodium chloride 0.9 % bolus 500 mL (0 mLs Intravenous Stopped 08/24/18 1732)    Note:  This document was prepared using Dragon voice recognition software and may include unintentional dictation errors.  Alona Bene, MD Emergency  Medicine    Long, Arlyss Repress, MD 08/25/18 1100

## 2018-08-24 NOTE — ED Triage Notes (Signed)
Pt endorses mechanical fall 2 days ago and complains of right hip and ankle pain since. Also endorses n/v/d with generalized weakness for several days. VSS.

## 2018-08-24 NOTE — ED Notes (Signed)
Ortho will be delayed in splinting right ankle.

## 2018-08-24 NOTE — H&P (Addendum)
Date: 08/24/2018               Patient Name:  Sonya Cohen MRN: [id number]  DOB: 04/24/1961 Age / Sex: 57 y.o., female   PCP: [facility]         Medical Service: Internal Medicine Teaching Service         Attending Physician: Dr. Burns Spain, MD    First Contact: Dr. Dortha Schwalbe Pager: [phone]  Second Contact: Dr. 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  She reports that about 1 week ago she experienced a sudden onset nausea and bilious emesis which has been ongoing. Last episode of emesis was in the ED consistent with bilious material. 3 days prior to the onset of symptoms she had left lower quadrant pain.  Initially pain was 6/10 but had gradually increased to 8/10 a day prior to admission.  Pain is non-radiating, constant and improved with water.  She also reports a one-time episode of diarrhea which has since resolved.  Since onset, she has experienced diaphoresis, felt lightheaded especially with standing which resulted in GLF 2 days ago with posterior head trauma.  She has also endorsed intermittent palpitation, lower extremity weakness, fatigue and mild shortness of breath but however denies chest pain, dysrhythmia, syncope, fevers, chills, vision abnormalities, headache.  She endorses good appetite and has been drinking about 6 bottles of water a day to keep hydrated while continuing to take her antihypertensive (HCTZ).  She also reports of a 60  pound weight loss in the past 3 months, unintentional but denies night sweats.  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 Pap smear in 2016 reviewed low-grade squamous intraepithelial lesions, mild dysplasia.  ED course: BP 95/60 (SBP range 83-161), RR 22, pulse 78, afebrile, SPO2 100% on ambient air.  EKG revealed normal sinus rhythm, CT head and cervical spine reviewed no acute brain injury, CT abdomen and pelvis revealed chronic inflammatory changes of the colon, coarse calcification within the right ovary, x-ray right ankle revealed displaced oblique fracture of distal right fibula with soft tissue swelling.  Labs were significant for sodium 122, potassium 2.2, chloride <65, mildly elevated AST at 47, elevated total bilirubin of 3.7, elevated hemoglobin of 17.7, elevated hCG at 15.4.  She was started on IV potassium chloride, normal saline infusion and received Reglan, morphine in the ED.    Meds: Patient reports that she only takes hydrochlorothiazide 25 mg daily and hydrocodone.  No outpatient medications have been marked as taking for the 08/24/18 encounter [facility] Encounter).     Allergies: Allergies as of 08/24/2018 - Review Complete 08/24/2018  Allergen Reaction Noted  . Anesthetics, ester Nausea And Vomiting 02/17/2015   Past Medical History:  Diagnosis Date  . Hypertension     Family History: Father deceased with brain and lung cancer.  She also reports of family history of skin cancer.  Social History:  -Current cigarette smoker.  Smokes about 1 pack/day for 30 years. -  Current EtOH use.  Drinks 1 mini Budweiser per day for 20 years. -Denies illicit or IV drug use  Review of Systems: A complete ROS was negative except as per HPI.    Physical Exam: Blood pressure 131/77, pulse 78, temperature 98.6 F (37 C), temperature source Oral, resp. rate 13, height 5\' 2"  (1.575 m), weight 63.5 kg, SpO2 100  %.  Physical Exam  Constitutional: She is oriented to person, place, and time and well-developed, well-nourished, and in no distress. No distress.  HENT:  Head: Normocephalic and atraumatic.  Mouth/Throat: Mucous membranes are pale, dry and not cyanotic. She does not have dentures. No oral lesions. Abnormal dentition. No dental abscesses or lacerations.  Cardiovascular: Normal rate, regular rhythm, normal heart sounds and intact distal pulses. Exam reveals no gallop and no friction rub.  No murmur heard. Pulmonary/Chest: Effort normal and breath sounds normal. No respiratory distress. She has no wheezes. She has no rales.  Abdominal: Soft. Bowel sounds are normal. She exhibits no distension. There is no tenderness. There is no rebound and no guarding.  Neurological: She is alert and oriented to person, place, and time. No cranial nerve deficit. GCS score is 15.  Skin: Skin is warm and dry. No rash noted. She is not diaphoretic.  Noticeable actinic lesions on the anterior chest.  Psychiatric: Mood and affect normal.    EKG: personally reviewed my interpretation normal sinus with right atrial enlargement   Assessment & Plan by Problem: Active Problems:   Hyponatremia  Ms. 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  Hypotonic hypovolemic Hyponatremia Hypo-kalemia, hypochloremia Presents with 1 week history of acute onset nausea with bilious emesis.  Also reported of a one-time episode of diarrhea with no associated fevers or chills.  Since onset, she has continued taking hydrochlorothiazide 20 mg daily while trying to compensate for dehydration by drinking 6 bottles of water a day.  On arrival to the ED, she was  hypotensive with SBP in the 80s-90s.  CMP significant for sNa of 122, K+ 2.2, Cl <65. Her calculated sOsm was 259.  Unable to calculate anion gap due to significantly low serum chloride.  On physical exam, she was alert and oriented to place, person and time, mucous membrane is dry, decreased skin turgor was noted.  -Continue NS at 100 mL/hour (goal sNa 126-128 @11pm  08/24/18) -Continuous cardiac monitoring -BMP every 4 hours -Follow-up a.m. CMP, Mg  Hypertension: Hold home hydrochlorothiazide 20 mg daily given low BPs.  Right ovarian calcification: Presented with 1 week history of left lower quadrant pain that had resolved after receiving morphine in the ED.  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  In addition her hCG was markedly elevated at 15.4.  On physical exam, she did not endorse abdominal tenderness on palpation.  She does have several risk factors for malignancy including a long-standing smoking and alcohol use.  -Follow-up a.m. AFP, LDH  Hemo-concentration/?Polycythemia: CBC noticeable for elevated hemoglobin at 17.1, decrease RDW.  This is most likely due to hemo-concentration from dehydration and will follow up on a.m. CBC. -Follow-up a.m. CBC  Ground-level fall Right distal fibula fracture She experienced a ground-level fall 2 days ago as a result of generalized weakness from ongoing nausea, vomiting, diuretic use.  X-ray  right ankle revealed displaced oblique fracture of distal right fibula with soft tissue swelling.  A stirrup splint of right ankle was placed in the ED. -Will monitor for pain and treat appropriately.   FEN: N.p.o., replace electrolytes as needed, normal saline at 100 mL/hour VTE prophylaxis: Lovenox 40 mg SQ CODE STATUS: Full code  Dispo: Admit patient to inpatient with expected length of stay greater than 2 midnights.    Signed: Yvette Rack, MD 08/24/2018, 5:53 PM  Pager:  [phone] IMTS PGY-1

## 2018-08-24 NOTE — Progress Notes (Signed)
Orthopedic Tech Progress Note Patient Details:  Sonya Cohen September 09, 1961 [id number]  Ortho Devices Type of Ortho Device: Ace wrap, Post (short leg) splint, Stirrup splint Ortho Device/Splint Location: rle Ortho Device/Splint Interventions: Application   Post Interventions Patient Tolerated: Well Instructions Provided: Care of device   [name], Raghad Lorenz 08/24/2018, 5:00 PM

## 2018-08-25 ENCOUNTER — Inpatient Hospital Stay (HOSPITAL_COMMUNITY): Payer: Self-pay

## 2018-08-25 ENCOUNTER — Encounter (HOSPITAL_COMMUNITY): Payer: Self-pay | Admitting: Internal Medicine

## 2018-08-25 DIAGNOSIS — R269 Unspecified abnormalities of gait and mobility: Secondary | ICD-10-CM

## 2018-08-25 DIAGNOSIS — S8261XD Displaced fracture of lateral malleolus of right fibula, subsequent encounter for closed fracture with routine healing: Secondary | ICD-10-CM

## 2018-08-25 DIAGNOSIS — E872 Acidosis: Secondary | ICD-10-CM

## 2018-08-25 DIAGNOSIS — Z96659 Presence of unspecified artificial knee joint: Secondary | ICD-10-CM

## 2018-08-25 DIAGNOSIS — E861 Hypovolemia: Secondary | ICD-10-CM

## 2018-08-25 DIAGNOSIS — W19XXXD Unspecified fall, subsequent encounter: Secondary | ICD-10-CM

## 2018-08-25 LAB — BASIC METABOLIC PANEL
Anion gap: 16 — ABNORMAL HIGH (ref 5–15)
Anion gap: 13 (ref 5–15)
Anion gap: 12 (ref 5–15)
BUN: 17 mg/dL (ref 6–20)
BUN: 15 mg/dL (ref 6–20)
BUN: 14 mg/dL (ref 6–20)
CHLORIDE: 77 mmol/L — AB (ref 98–111)
CHLORIDE: 81 mmol/L — AB (ref 98–111)
CHLORIDE: 84 mmol/L — AB (ref 98–111)
CO2: 33 mmol/L — ABNORMAL HIGH (ref 22–32)
CO2: 33 mmol/L — ABNORMAL HIGH (ref 22–32)
CO2: 30 mmol/L (ref 22–32)
CREATININE: 0.99 mg/dL (ref 0.44–1.00)
CREATININE: 0.97 mg/dL (ref 0.44–1.00)
Calcium: 8.3 mg/dL — ABNORMAL LOW (ref 8.9–10.3)
Calcium: 8.3 mg/dL — ABNORMAL LOW (ref 8.9–10.3)
Calcium: 8.2 mg/dL — ABNORMAL LOW (ref 8.9–10.3)
Creatinine, Ser: 0.83 mg/dL (ref 0.44–1.00)
GFR calc Af Amer: >60 mL/min (ref >60)
GFR calc Af Amer: >60 mL/min (ref >60)
GFR calc non Af Amer: >60 mL/min (ref >60)
GFR calc non Af Amer: >60 mL/min (ref >60)
GFR calc non Af Amer: >60 mL/min (ref >60)
GLUCOSE: 87 mg/dL (ref 70–99)
Glucose, Bld: 74 mg/dL (ref 70–99)
Glucose, Bld: 85 mg/dL (ref 70–99)
Potassium: 2.3 mmol/L — CL (ref 3.5–5.1)
Potassium: 2.1 mmol/L — CL (ref 3.5–5.1)
Potassium: 3 mmol/L — ABNORMAL LOW (ref 3.5–5.1)
SODIUM: 126 mmol/L — AB (ref 135–145)
SODIUM: 126 mmol/L — AB (ref 135–145)
Sodium: 127 mmol/L — ABNORMAL LOW (ref 135–145)

## 2018-08-25 LAB — URINALYSIS, COMPLETE (UACMP) WITH MICROSCOPIC
BILIRUBIN URINE: NEGATIVE
Glucose, UA: 50 mg/dL — AB
Hgb urine dipstick: NEGATIVE
Ketones, ur: 20 mg/dL — AB
Leukocytes, UA: NEGATIVE
Nitrite: NEGATIVE
Protein, ur: NEGATIVE mg/dL
SPECIFIC GRAVITY, URINE: 1.012 (ref 1.005–1.030)
pH: 7 (ref 5.0–8.0)

## 2018-08-25 LAB — NA AND K (SODIUM & POTASSIUM), RAND UR
POTASSIUM UR: 21 mmol/L
SODIUM UR: 49 mmol/L

## 2018-08-25 LAB — RAPID URINE DRUG SCREEN, HOSP PERFORMED
Amphetamines: NOT DETECTED
Barbiturates: NOT DETECTED
Benzodiazepines: NOT DETECTED
Cocaine: NOT DETECTED
OPIATES: POSITIVE — AB
Tetrahydrocannabinol: POSITIVE — AB

## 2018-08-25 LAB — PHOSPHORUS
PHOSPHORUS: 2.1 mg/dL — AB (ref 2.5–4.6)
Phosphorus: 1 mg/dL — CL (ref 2.5–4.6)

## 2018-08-25 LAB — LACTATE DEHYDROGENASE: LDH: 132 U/L (ref 98–192)

## 2018-08-25 LAB — LACTIC ACID, PLASMA
Lactic Acid, Venous: 4.9 mmol/L (ref 0.5–1.9)
Lactic Acid, Venous: 1.2 mmol/L (ref 0.5–1.9)

## 2018-08-25 LAB — SALICYLATE LEVEL: Salicylate Lvl: <7 mg/dL (ref 2.8–30.0)

## 2018-08-25 LAB — CORTISOL-AM, BLOOD: Cortisol - AM: 20.5 ug/dL (ref 6.7–22.6)

## 2018-08-25 LAB — OSMOLALITY: Osmolality: 258 mOsm/kg — ABNORMAL LOW (ref 275–295)

## 2018-08-25 LAB — OSMOLALITY, URINE: Osmolality, Ur: 324 mOsm/kg (ref 300–900)

## 2018-08-25 LAB — HIV ANTIBODY (ROUTINE TESTING W REFLEX): HIV SCREEN 4TH GENERATION: NONREACTIVE

## 2018-08-25 LAB — MAGNESIUM: Magnesium: 2.1 mg/dL (ref 1.7–2.4)

## 2018-08-25 LAB — CHLORIDE, URINE, RANDOM: Chloride Urine: 49 mmol/L

## 2018-08-25 MED ORDER — LORAZEPAM 2 MG/ML IJ SOLN
0.5000 mg | Freq: Once | INTRAMUSCULAR | Status: AC
  Administered 2018-08-25: 0.5 mg via INTRAVENOUS
  Filled 2018-08-25: qty 1

## 2018-08-25 MED ORDER — KETOROLAC TROMETHAMINE 15 MG/ML IJ SOLN
7.5000 mg | Freq: Once | INTRAMUSCULAR | Status: AC
  Administered 2018-08-25: 7.5 mg via INTRAVENOUS
  Filled 2018-08-25: qty 1

## 2018-08-25 MED ORDER — LACTATED RINGERS IV BOLUS
1000.0000 mL | Freq: Once | INTRAVENOUS | Status: AC
  Administered 2018-08-25: 1000 mL via INTRAVENOUS

## 2018-08-25 MED ORDER — SENNA 8.6 MG PO TABS
1.0000 | ORAL_TABLET | Freq: Once | ORAL | Status: AC
  Administered 2018-08-25: 8.6 mg via ORAL
  Filled 2018-08-25: qty 1

## 2018-08-25 MED ORDER — LACTATED RINGERS IV BOLUS: 500.0000 mL | Freq: Once | INTRAVENOUS | Status: DC

## 2018-08-25 MED ORDER — ACETAMINOPHEN 325 MG PO TABS
650.0000 mg | ORAL_TABLET | Freq: Four times a day (QID) | ORAL | Status: DC
  Administered 2018-08-25 – 2018-08-29 (×10): 650 mg via ORAL
  Filled 2018-08-25 (×14): qty 2

## 2018-08-25 MED ORDER — IBUPROFEN 200 MG PO TABS
400.0000 mg | ORAL_TABLET | Freq: Four times a day (QID) | ORAL | Status: DC
  Administered 2018-08-25 – 2018-08-27 (×7): 400 mg via ORAL
  Administered 2018-08-28: 200 mg via ORAL
  Administered 2018-08-28 – 2018-08-29 (×3): 400 mg via ORAL
  Filled 2018-08-25 (×14): qty 2

## 2018-08-25 MED ORDER — IBUPROFEN 200 MG PO TABS
400.0000 mg | ORAL_TABLET | Freq: Four times a day (QID) | ORAL | Status: DC
  Filled 2018-08-25: qty 2

## 2018-08-25 MED ORDER — POTASSIUM CHLORIDE 10 MEQ/100ML IV SOLN
10.0000 meq | INTRAVENOUS | Status: AC
  Administered 2018-08-25 (×3): 10 meq via INTRAVENOUS
  Filled 2018-08-25 (×4): qty 100

## 2018-08-25 MED ORDER — K PHOS MONO-SOD PHOS DI & MONO 155-852-130 MG PO TABS
500.0000 mg | ORAL_TABLET | Freq: Once | ORAL | Status: DC
  Filled 2018-08-25: qty 2

## 2018-08-25 MED ORDER — POTASSIUM CHLORIDE CRYS ER 20 MEQ PO TBCR
40.0000 meq | EXTENDED_RELEASE_TABLET | Freq: Three times a day (TID) | ORAL | Status: AC
  Administered 2018-08-25 (×2): 40 meq via ORAL
  Filled 2018-08-25 (×2): qty 2

## 2018-08-25 MED ORDER — ACETAMINOPHEN 325 MG PO TABS
650.0000 mg | ORAL_TABLET | Freq: Four times a day (QID) | ORAL | Status: DC
  Filled 2018-08-25: qty 2

## 2018-08-25 NOTE — Discharge Summary (Addendum)
Name: Sonya Cohen MRN: [id number] DOB: 28-Sep-1961 57 y.o. PCP: [facility]  Date of Admission: 08/24/2018 11:17 AM Date of Discharge: 09/04/2018 Attending Physician: Burns Spain, MD  Discharge Diagnosis: 1. Right distal fibula fracture following ground level fall 2. Hypotonic hypovolemicHyponatremia 4. Electrolyte abnormalities: Hypokalemia, hypo-phosphatemia, hypochloremia  5. Acute Kidney Injury  6. Adjustment disorder with mixed disturbance of emotions and conduct  Discharge Medications: Allergies as of 09/04/2018      Reactions   Anesthetics, Ester Nausea And Vomiting   Anesthesia = Nausea and vomiting      Medication List    STOP taking these medications   hydrochlorothiazide 25 MG tablet Commonly known as:  HYDRODIURIL   ibuprofen 800 MG tablet Commonly known as:  ADVIL,MOTRIN   LORazepam 0.5 MG tablet Commonly known as:  ATIVAN     TAKE these medications   acetaminophen 325 MG tablet Commonly known as:  TYLENOL Take 2 tablets (650 mg total) by mouth every 6 (six) hours as needed for moderate pain.   calcium-vitamin D 500-200 MG-UNIT tablet Commonly known as:  OSCAL WITH D Take 1 tablet by mouth 2 (two) times daily.   multivitamin with minerals Tabs tablet Take 1 tablet by mouth daily. Start taking on:  09/05/2018   OLANZapine 5 MG tablet Commonly known as:  ZYPREXA Take 1 tablet (5 mg total) by mouth 2 (two) times daily as needed (Aggitation).   oxyCODONE 5 MG immediate release tablet Commonly known as:  Oxy IR/ROXICODONE Take 1 tablet (5 mg total) by mouth every 4 (four) hours as needed for up to 7 days for severe pain.   polyethylene glycol packet Commonly known as:  MIRALAX / GLYCOLAX Take 17 g by mouth daily as needed for moderate constipation or severe constipation.   venlafaxine XR 75 MG 24 hr capsule Commonly known as:  EFFEXOR-XR Take 75 mg by mouth daily.       Disposition and follow-up:     Sonya Cohen was discharged from [facility] in Stable condition.  At the hospital follow up visit please address:  1. A)Right distal fibula fracture following ground level fall: She underwent an open treatment of right lateral malleolus and ORIF syndesmosis. Her pain has been well-controlled with scheduled Oxycodone 5 mg IR q4h/         B)Hypotonic hypovolemicHyponatremia: Resolved with IV fluid resuscitation      C)Electrolyte abnormalities: Hypokalemia, hypo-phosphatemia, hypochloremia: Resolved with repletion            E)Adjustment disorder with mixed disturbance of emotions and conduct: Started on Zyprexa 5mg  twice daily prn agitation and has remained well-controlled over the last several days.      F)Right ovarian calcification: Requires Gyn consult outpatient  2.  Labs / imaging needed at time of follow-up: None  3.  Pending labs/ test needing follow-up: None  Follow-up Appointments:  Hospital Course by problem list: 1. 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   She presented with 1 week history of acute onset nausea with bilious emesis.  Also reported of a one-time episode of diarrhea with no associated fevers or  chills. She had continued taking hydrochlorothiazide 20 mg daily even in the setting of her acute symptoms. She treid to compensate for dehydration by drinking 6 bottles of water a day.  On arrival to the ED, she was hypotensive with SBP in the 80s-90s.  CMP significant for sNa of 122, K+ 2.2, Cl <65. Her calculated sOsm was 259, urine Osm  was 325 making volume contraction the likely cause of her hypotension and electrolyte imbalance. She received IV Fluids with lactate ringers and normal saline. Her electrolytes were successfully repleted as needed.   Ground level Fall Right distal fibula fracture s/p Sonya Cohen had experienced a ground-level fall 2 days prior to admission as a result of generalized weakness from ongoing nausea, vomiting, diuretic use.  X-ray right ankle revealed displaced oblique fracture of distal right fibula with soft tissue swelling. Orthopedic surgery was consulted and she underwent open treatment of right lateral malleolus and ORIF syndesmosis.. She might possibly have osteoporosis given that her fracture was a resultant of a ground level fall. Her calcium was normal and Vitamin D was found to be low. She was started on Vitamin D supplementation and will need to continue this post-discharge. She will benefit from a bisphosphonate therapy but will hold until 2-4 weeks after discharge   Right ovarian calcification: Presented with 1 week history of left lower quadrant pain that had resolved after receiving morphine in the ED.  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  In addition her hCG was markedly elevated at 15.4.  On physical exam, she did not endorse abdominal tenderness on palpation.  She does have several risk factors for malignancy including a long-standing smoking and alcohol use. She will need follow-up with outpatient gynecology.  Adjustment disorder with mixed disturbance of emotions and conduct: During her admission, she had episodes of agitation and confusion though she remained alert and oriented to place, time and person. At one instance, she was found naked by nursing staff, wandering in the unit hallway and pacing in her room. There were also reports of violent behavior for which she required a low dose Haldol and Seroquel.  She was worked up for early dementia and TSH, RPR, Vitamin B12, HIV and CT head were unremarkable. She scored 18/30 on her MOCA however she was AAOx3 and able to recall all 3 words at 1 and 5 minutes. Repeat MOCA was 22/30 and suggested mild cognitive impairment. She was evaluated by psychiatry and received the above diagnosis and was started on Zyprexa 5mg  twice a day as needed for agitation.   Upon speaking to the son Jill Alexanders, he made me aware that toward the end of July, his mom retired from her 38-year job and cashed out her 401(k).  She has been living in a hotel since.  Per his knowledge, she has had 4 episodes of nighttime confusion since August and each time law enforcement has been contacted.  The episodes are listed below: 1.  Found walking at [facility]  2.  Drove her car to [place] until she ran out of gas and was found sleeping in her car 3.  Found walking barefoot at night with no shoes near [employer] 4.  Was found multiple times trying to walk into neighbors rooms at her hotel  Discharge Vitals:   BP (!) 83/50 (BP Location: Right Arm)   Pulse 81   Temp 98.3 F (36.8 C) (Oral)   Resp 18  Ht 5\' 3"  (1.6 m)   Wt 76.2 kg   SpO2 100%   BMI 29.76 kg/m   Pertinent Labs, Studies, and Procedures:  BMP Latest Ref Rng & Units 09/04/2018 09/03/2018 09/02/2018  Glucose 70 - 99 mg/dL 811(B107(H) 94 81  BUN 6 - 20 mg/dL 9 19 11   Creatinine 0.44 - 1.00 mg/dL 1.470.71 8.290.87 5.620.98  Sodium 135 - 145 mmol/L 132(L) 131(L) 130(L)  Potassium 3.5 - 5.1 mmol/L 3.0(L) 3.3(L) 3.7  Chloride 98 - 111 mmol/L 90(L) 90(L) 94(L)  CO2 22 - 32 mmol/L 30 30 25   Calcium 8.9 - 10.3 mg/dL 9.2 1.3(Y8.8(L) 8.6(V8.6(L)   CBC Latest Ref Rng & Units 09/02/2018 08/24/2018 08/24/2018  WBC 4.0 - 10.5 K/uL 7.1 6.4 10.1  Hemoglobin 12.0 - 15.0 g/dL 78.413.5 69.613.2 17.1(H)  Hematocrit 36.0 - 46.0 % 39.2 35.0(L) 46.3(H)  Platelets 150 - 400 K/uL 262 213 337   Vitamin D levels: 24.1    Signed: Julian HyJamie Prince MD Pager:  [phone] IMTS PGY-1

## 2018-08-25 NOTE — Progress Notes (Addendum)
Talked with Rapid response nurse regarding pt's BP hypotensive- alert and oriented MD aware of pt's bp. Giving bolus of LR and will give bolus of K+ as ordered. Manual BP 80/40 in left arm and 70/30 in rt arm. Pt is asymptomatic  BP 86/52 HR 99  0107 am notified MD of Lactic acid 4.9

## 2018-08-25 NOTE — Progress Notes (Signed)
Subjective:   Overnight:  -Remained hypotensive received 1.5L LR -Lactic acidosis resolved s/p IVF -Complained of right ankle pain and received tylenol + toradol  -Hypokalemia on evening BMP  Today,Mrs.Cashin was examined and evaluated at bedside this AM. She reports that she is feeling good except for right ankle pain and requesting more pain medication. States bp cuff has not been working because her bp has been reading low. She believes her bp is high because she can feel her face is flushed. She is AAOx3. She states she does not take aspirin but takes acetaminophen very irregularly for pain. Denies any abdominal pain, nausea, vomiting. Voiding and eliminating without problem. Able to tolerate crackers and peanut butter. Denies any headache, dizziness on upstanding, double vision or palpitations. However she states she is a little foggy. Denies melena, hematuria, cough, excessive menstrual bleeding.  When asked further about her fall at home, she mentions she has had prior hx of knee replacement and she has been holding off on getting her hip replaced. States she has gait abnormalities at baseline due to muscle pain which may have led to her fall at home which is causing her ankle pain. She mentions she was in [place] and [place] for 2 months but returned to [place] about a week or two ago. Denies any sick contact or consuming expired food. Does mention she lost weight recently due to all the traveling.    Objective:  Vital signs in last 24 hours: Vitals:   08/25/18 0631 08/25/18 0700 08/25/18 0730 08/25/18 0832  BP:  (!) 87/47  (!) 83/50  Pulse:      Resp:      Temp:    98.3 F (36.8 C)  TempSrc:    Oral  SpO2:   100% 100%  Weight: 76.2 kg     Height:       Const: In moderate distress secondary to right ankle pain, AAOx3, lying comfortably in ned  CV: RRR, nno MGR Resp: CTABL, no wheezes, crackles, rhonchi  Abd: BS+, non distended, NTTP  Ext: Right ankle TTP, not  swollen or erythematous  Assessment/Plan:  Active Problems:   Hyponatremia   Hypokalemia   Non-intractable vomiting with nausea   Lower abdominal pain   Closed fracture of right distal fibula   Right leg pain   Head injury   Cervical strain  Ms. 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  Hypotonic hypovolemic Hyponatremia: Resolved with IV Fluids. Current sNa of 127 which is an appropriate correction. Urine osmol was elevated at 325 making primary or psychogenic polydipsia unlikely. Hyponatremia was most likely secondary to volume contraction from recent history of nausea and vomiting with concomitant use of HCTZ -Continue to monitor BMP   Hypotension: BP with SBP range 70s-80s in the past 24 hours. She has remained afebrile, normal O2 saturation, no signs of systemic infection though she had elevated lactic acid which corrected with IVF most likely due to tissue hypoperfusion.  -Continue IVF  AKI: Resolved  Recent viral gastroenteritis  Hypo-kalemia, hypochloremia Per patient she had been traveling to [place] and [place] for the past 2-3 months and reports of diarrhea with nausea and vomiting prior to admission. She denies any nausea or vomiting since admission. K+ remains low at 2.1 (2.3<<2.4) despite IV KCL x 9 +  KDur 80mEq. Chloride has however improved with normal saline infusion  -Continue K+ repletion   Right distal fibula fracture s/p GLF: Ortho to place RLE splint  -Pain regimen: Schedule Tylenol + motrin q6 for at least the next 5 days  ?Substance use: Obtaining Utox   CIWA: Current EtOH use but unclear about quantity and last etoh use   Dispo: Anticipated discharge in approximately 2-3  day(s).   Yvette RackAgyei, Obed K, MD 08/25/2018, 10:04 AM Pager: [phone] IMTS PGY-1

## 2018-08-25 NOTE — Progress Notes (Signed)
Paged by RN about hypotension: BP 70s/60s with MAP of 51 and HR 70-80s. BP was rechecked manually. Went to evaluate patient at bedside. She was asymptomatic at the time and without acute complaints. She appeared alert and was oriented x3. Lactic acid ordered as well as 500cc bolus of LR. Did not order NS as she is hyponatremic and tried to avoid overcorrection of sodium.   LA 4.9 s/p 500cc LR and an additional 1L LR bolus ordered with resolution of lactic acidosis and improvement in BP. Reviewed patient's chart. She presented yesterday with N/V and decreased PO intake and was found to be severely hyponatremic and hypokalemic. She was also found to have a non-displaced R fibula fracture that was splinted by ortho tech. She was afebrile, hemodynamically stable and without leukocytosis on presentation. She did not endorse any symptoms concerning for infection, but I ordered blood cultures given persistent hypotension.  One hour after LR bolus patient became hypotensive again to 70-80/60s (also rechecked manually). Rapid response RN paged. Went to evaluate patient at bedside. Her only complaint at the time was R ankle pain. She appeared in moderate distress due to pain. She was alert but did appeared somewhat confused at times stating that she had surgery in her RLE yesterday. Her cardiac and lung exam were normal. Her LE were warmed to the touch but her UE were cold, and she had delayed capillary refill. We removed bandages and splint placed by ortho due to concern for compartment syndrome. She was exquisitely TTP over R ankle, but there was no pallor or numbness. She also had strong DP and PT pulses (also checked with doppler US). We ordered repeat imaging of RLE as she had been ambulating to the bedside commode earlier in the day before onset of pain. We also ordered an additional 1L LR bolus. Ordered Tylenol and Toradol for pain (AKI resolved). I did not order narcotics for pain given her low blood pressure. Her  BP cuff and tele monitor were also switched to corroborate low BPs were accurate, which they were.   Of note, patient presented with severe hypokalemia. She has received a total of 9 runs of IV potassium as well as 80 mEq of PO Kdur with no improvement in serum potassium. Her most recent BMP shows K 2.3. Ordered Mag and Phos (2.3 and 2.1 respectively). I also ordered an AM cortisol to initiate evaluation for adrenal insufficiency.   Burna CashIdalys Santos-Sanchez, MD  Internal Medicine PGY-2  P [phone]

## 2018-08-25 NOTE — Progress Notes (Addendum)
Pt refused last bag of K+ and said she was not taking any oral K+ . She says she knows her body and her bp is up checked manual bp left arm 80/60. Changed cuffs and then checked BP on rt wrist. See BP.  MD notified  [phone] Md here with Rapid response nurse and saw pt. Ptc/o of severe pain rt leg. Toradol given as ordered.

## 2018-08-25 NOTE — Progress Notes (Signed)
Orthopedic Tech Progress Note Patient Details:  Sonya Cohen March 04, 1961 [id number]  Ortho Devices Type of Ortho Device: Ace wrap, Post (short leg) splint, Stirrup splint Ortho Device/Splint Location: rle Ortho Device/Splint Interventions: Application   Post Interventions Patient Tolerated: Well Instructions Provided: Care of device   [name], [name] 08/25/2018, 10:56 AM

## 2018-08-25 NOTE — Progress Notes (Signed)
Patient agitated about not receiving more pain medications. Educated patient that her B/P is low and that pain medication will lower her B/P further.  I have administered ordered Tylenol and Motrin. I elevated right lower extremity on pillow and placed a ice pack. Patient is refusing potassium replacement stating that she knows her body. Explained the importance of Potasium and Cardiac functioning. Son called while I was in the room with patient and wanted an update. Son states that mom has had increased confusion and wanted her to be seen by Psych. States that she has recently gotten lost and that the Cops have picked her up and this is unusual for her. Paged MD to make aware of situation. Also refusing lab draws at this time. VS stable will continue to monitor.

## 2018-08-25 NOTE — Progress Notes (Signed)
Date: 08/25/2018  Patient name: Sonya Cohen  Medical record number: [id number]  Date of birth: [birth year]   I have seen and evaluated Sonya Cohen and discussed their care with the Residency Team.  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  She states she retired from her job 2 or 3 months ago and has been traveling and moving between [place], [place], and [place].  She states she just returned to [place] 1 to 2 weeks ago.  1 week ago she had sudden onset of nausea and vomiting.  This is associated with left lower quadrant pain and one episode of diarrhea.  Subsequently, she has been drinking lots of water and has experienced diaphoresis, lightheadedness, palpitations, lower extremity weakness, fatigue, and dyspnea.  She felt prior to admission at home because her legs felt weak and painful.  This resulted in right ankle pain.  In the ED, she was found to be hypertensive, hyponatremic, with a lactic acidosis, and a left distal fibula fracture.  This morning, she has tolerated crackers and peanut butter without increased abdominal pain or nausea.  She reports significant pain to her right ankle and is requesting additional pain control.  Despite hypotension by the automatic cuff and the manual cuff, she states her blood pressure is high because she can feel her face is flushed.  She did not feel dizzy or lightheaded when she used the bedside toilet.  She denies cough, vaginal bleeding, hematuria, melena, or bright red blood per rectum.  Chart review She is on lorazepam 0.5 twice daily for GAD and has been for years.  Last picked up earlier this month.  Last opioid, tramadol, was filled on April. Weight April 3 of this year was 172 pounds or 78 kg Son told nurse that the patient has had increased confusion, gotten lost, and has been picked up by the police.  Vitals:   08/25/18 0730  08/25/18 0832  BP:  (!) 83/50  Pulse:    Resp:    Temp:  98.3 F (36.8 C)  SpO2: 100% 100%  BP 80's / 50's in both right and left upper extremity HRRR no MRG LCTAB with good airflow ABD + BS, soft Ext no edema Ecchymosis around left medial ankle  Na 122 - 124 - 125 - 126 - 127 over about 18 hours K 2.2 - 2.4 - 2,1 Cl < 65 - 81 Cr 1.57 - 0.97 Gap 24 - 13 LA 4.9 - 1.2 LFT's nl WBC 6.4 HgB 13.2 Plts 213 UA 20 ketones Urine osm inappropriately concentrated Cortisol 20.5  CT ABD : Normal appendix, homogeneous liver, normal pancreas, likely right ovarian dermoid, chronic superior end plate fracture of L3  CT head negative for acute  I personally viewed her right ankle x-rays which showed a fracture of the distal fibula with slight displacement  Personally viewed the EKG which shows sinus rhythm, normal axis, right atrial enlargement, no ischemic changes.  Assessment and Plan: I have seen and evaluated the patient as outlined above. 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  She presents with 1 week of nausea, vomiting, left lower quadrant pain, and increased fluid intake.  She was found to be hypotensive with a lactic acidosis which  is since resolved.  She also has hyponatremia, hypokalemia, and hypochloremia.  Her AKI which was present on admission, has resolved.  Her urine osmolality was inappropriately concentrated ruling out excessive fluid intake as a cause of the hyponatremia.  The most likely sequence of events was some sort of viral food poisoning from her travels which led to her GI symptoms which led to volume contraction and electrolyte abnormalities.  Her GI symptoms seem to have resolved.  Although her hypotension has not resolved, lactic acidosis has resolved and  her electrolyte abnormalities are improving with IV fluids.  Her report of a 60 pound weight loss in the past 2 months is not accurate per records which show a 4 pound weight loss since April.  Life-threatening causes of hypotension have been considered and ruled out including but not limited to PE, anaphylactic shock, septic shock, tension pneumothorax.  At this time, I would continue with IV fluid resuscitation, monitoring her electrolytes, advancing her diet.  Additional information obtained this morning by her RN includes concern by her son and increasing confusion.  1.  Hypotension -she is not tachycardic, not tachypnea, and not hypoxic.  Continue IV fluids and increase her diet.  2.  Nausea, vomiting, diarrhea -likely secondary to acute viral gastroenteritis which is has resolved.  A GI panel will not be of benefit as her symptoms are resolving spontaneously.  Advance diet and follow.  3.  Hyponatremia, hypokalemia, hypo-chloremia, AKI -secondary to volume contraction.  They are resolving or improving on IV fluids which will be continued.  4.  Chronic benzo use -she is on lorazepam 0.5 orally twice daily.  The incidence of hypotension with lorazepam as long.  It can cause sedation which she currently does not have.  Long-term use can be associated with mental status changes.  I would favor continuing the lorazepam but at a lower dose to prevent withdrawal symptoms.  5.  Report of confusion per son -further investigate by inpatient team.  Burns Spain, MD 9/24/201910:06 AM

## 2018-08-25 NOTE — Care Management Note (Addendum)
Case Management Note  Patient Details  Name: Sonya Cohen MRN: [id number] Date of Birth: Oct 01, 1961  Subjective/Objective:      From home alone, hypokalemia, hyponatremia, RLE fibulia fx, patient states she has a PCP at [facility] here in [place]. And she can schedule her follow up apt.   9/25 Sonya Capeeborah Tationa Stech RN, BSN - today NCM asked to see patient's insurance card she states " I dont have it yet ,but I am getting it."  NCM informed her that she will need ast with PCP and medications prior to dc, so NCM will  Need to set her up an apt to follow up with MD and will asist her with a Match Letter for her medications at discharge, she was in agreement.  Follow up apt set up with [facility] and Match letter given to patient for med asisstance and [facility] brochure given to patient also. Not sure how much she is comprehending this information.      10/4 Sonya Capeeborah Mayme Profeta RN, BSN -  Patient will be going to SNF at dc with LOG, Match letter has expired, she did not get to use it. IF she is readmitted again and needs a Match, will need to reinstate her.  CSW following for placement.            Action/Plan: DC to SNF when medically ready.  Expected Discharge Date:                  Expected Discharge Plan:     In-House Referral:     Discharge planning Services  CM Consult, Match  Post Acute Care Choice:    Choice offered to:     DME Arranged:    DME Agency:     HH Arranged:    HH Agency:     Status of Service:  In process, will continue to follow  If discussed at Long Length of Stay Meetings, dates discussed:    Additional Comments:  Sonya Cohen, Sonya Biglow Clinton, RN 08/25/2018, 1:41 PM

## 2018-08-25 NOTE — Progress Notes (Signed)
Patient requesting additional pain medication Toradol IV . Paged MD who stated he didn't want to order anything additional at this time due to risk of kidney injury. Made patient aware of MD orders. Patient now refusing to take next dose of IV potassium. Patient is restless in the bed and picking at Cardiac monitor and IV tubing. Patient pulled out IV earlier this shift stating she didn't need it. VS are currently stable with B/P improving now 108 systolic. Will continue to monitor.

## 2018-08-25 NOTE — Significant Event (Signed)
Rapid Response Event Note  Overview: Hypotension  Initial Focused Assessment: RN called and informed me that patient's was hypotensive, SBP 70-80s and MAPs in the 50s. Per RN, MDs were called, came and saw the patient, 500cc LR bolus was ordered. Per RN, patient was asymptomatic, skin warm and dry, and mental status was intact. I asked the RN to complete the fluid bolus and recheck VS then and inform IMTS MD and I would follow the patient as needed.  Lactic Acid was ordered and it resulted 4.9 at 0018, MDs ordered 1L LR bolus, SBP were improving then.   I went by to see the RN at 0500, when I arrived, IMTS MDs had just arrived to the bedside as well, SBPs were back in the 70-80s and MAPs were in the 50s. HR was in the 70-80s, oxygen saturations/RR were normal, and patient was afebrile. Manual BPs were check and equipment was changed out as well, and SBPs remained in 70-80s and MAPs in the 50s. Per patient she was has been voiding (had purewick but it was not working). Patient was alert, follows commands, oriented x 4 but was confused at times (patient that she had surgery yesterday on her leg, she did not). Patient had only one complaint - RLE pain (fibula fx).   Multiple current medical problems: hyponatremia, hypokalemia, and hypovolemia.   Interventions: - MDs ordered further labs, Toradol 15mg  IV x 1, 1L LR (total tonight after this bolus - 2.5L LR).  - While we were still working on plan of care, patient called out in excruciating pain, stated her pain was unbearable, soft cast was removed, + DP/+PT pulses, no discoloration - Repeat Labs and XR ordered for fibula fx.  Plan of Care: - Monitor VS, I/Os, Day IMTS MDs will follow up with patient this morning.  - Follow labs.  Event Summary:  - Start Time 0025 - End Time 0615  Maddy Graham R

## 2018-08-26 DIAGNOSIS — W1830XA Fall on same level, unspecified, initial encounter: Secondary | ICD-10-CM

## 2018-08-26 DIAGNOSIS — S82831A Other fracture of upper and lower end of right fibula, initial encounter for closed fracture: Secondary | ICD-10-CM

## 2018-08-26 LAB — BASIC METABOLIC PANEL
Anion gap: 9 (ref 5–15)
Anion gap: 8 (ref 5–15)
BUN: 12 mg/dL (ref 6–20)
BUN: 12 mg/dL (ref 6–20)
CALCIUM: 8.4 mg/dL — AB (ref 8.9–10.3)
CALCIUM: 8.3 mg/dL — AB (ref 8.9–10.3)
CO2: 28 mmol/L (ref 22–32)
CO2: 31 mmol/L (ref 22–32)
Chloride: 92 mmol/L — ABNORMAL LOW (ref 98–111)
Chloride: 93 mmol/L — ABNORMAL LOW (ref 98–111)
Creatinine, Ser: 0.76 mg/dL (ref 0.44–1.00)
Creatinine, Ser: 0.8 mg/dL (ref 0.44–1.00)
GFR calc Af Amer: >60 mL/min (ref >60)
GFR calc non Af Amer: >60 mL/min (ref >60)
GLUCOSE: 79 mg/dL (ref 70–99)
Glucose, Bld: 83 mg/dL (ref 70–99)
POTASSIUM: 3.4 mmol/L — AB (ref 3.5–5.1)
Potassium: 3.4 mmol/L — ABNORMAL LOW (ref 3.5–5.1)
SODIUM: 129 mmol/L — AB (ref 135–145)
SODIUM: 132 mmol/L — AB (ref 135–145)

## 2018-08-26 LAB — CALCIUM, IONIZED: CALCIUM, IONIZED, SERUM: 4.5 mg/dL (ref 4.5–5.6)

## 2018-08-26 LAB — PHOSPHORUS: PHOSPHORUS: 1.8 mg/dL — AB (ref 2.5–4.6)

## 2018-08-26 LAB — AFP TUMOR MARKER: AFP, Serum, Tumor Marker: 1.7 ng/mL (ref 0.0–8.3)

## 2018-08-26 MED ORDER — K PHOS MONO-SOD PHOS DI & MONO 155-852-130 MG PO TABS
500.0000 mg | ORAL_TABLET | ORAL | Status: DC
  Filled 2018-08-26 (×3): qty 2

## 2018-08-26 MED ORDER — K PHOS MONO-SOD PHOS DI & MONO 155-852-130 MG PO TABS
500.0000 mg | ORAL_TABLET | Freq: Once | ORAL | Status: AC
  Administered 2018-08-26: 500 mg via ORAL
  Filled 2018-08-26: qty 2

## 2018-08-26 MED ORDER — SODIUM PHOSPHATES 45 MMOLE/15ML IV SOLN
10.0000 mmol | Freq: Once | INTRAVENOUS | Status: AC
  Administered 2018-08-26: 10 mmol via INTRAVENOUS
  Filled 2018-08-26: qty 3.33

## 2018-08-26 NOTE — Progress Notes (Signed)
   Subjective:   Overnight: Per nurse, she was seen walking to the bathroom, naked and pulling out IV  Today, Sonya Cohen was examined at bedside was found lying comfortably in her bed.  She reports that she is feeling better this morning and was actually able to get some sleep last night.  She denied any episodes of confusion and states that her right lower extremity pain has significantly improved and on bothered by it this morning.  She was tearful at times during our assessment and reports that she misses her son and would like to see him.  She has a good appetite and denies chest pain, palpitation, shortness of breath, abdominal pain, headaches.  She indicates that she lives in an apartment by herself and lives on the ground floor.  She was agreeable to having physical therapy and occupational therapy evaluate her.  Objective:  Vital signs in last 24 hours: Vitals:   08/26/18 0300 08/26/18 0730 08/26/18 0754 08/26/18 1237  BP: 120/66  119/64 119/65  Pulse: 82     Resp: 16     Temp: 97.6 F (36.4 C)  98.1 F (36.7 C) 97.8 F (36.6 C)  TempSrc: Oral  Oral Oral  SpO2: 98% 96% 95% 98%  Weight:      Height:       Constitutional: In no acute distress, lying comfortably in bed, tearful at times Cardiovascular: Regular rate and rhythm.  Normal S1 and S2.  No murmurs, gallops, rubs Respiratory: Clear to auscultation bilaterally, no wheezes, crackles, rhonchi Abdomen: Bowel sounds present, nondistended, nontender to palpation Extremity: Right lower extremity in a splint. Neuro: Alert and oriented x3.  Was able to recall 3/3 items immediately but only able to recall 2/3 items after 5 minutes.  Assessment/Plan:  Active Problems:   Hyponatremia   Hypokalemia   Non-intractable vomiting with nausea   Lower abdominal pain   Closed fracture of right distal fibula   Right leg pain   Head injury   Cervical strain   Pain  [name] a 57 year old woman with medical history  significant for hypertension, melanomas/pexcision, left buttock squamous cell carcinoma in situs/pexcision6/2016,osteoarthritis, depression and generalized anxiety disorder who is being managed hyponatremia which has resolved, several electrolyte abnormalities and right distal fibula fracture following a ground-level fall.  Right distal fibula fracture s/p GLF: Indicates of significant improvement to her pain with the current pain regimen as well as application of the right lower extremity splint and is currently on bothered by the pain. -Pain regimen: Schedule Tylenol + motrin q6 for at least the next 5 days -PT/OT evaluation  Hypophosphatemia: Phos last night was 1. Initially refused K-phos medication. After speaking to her this morning. She is agreeable to taking medication  -Kphos 500mg  x 1 dose -Recheck Phos at 1900 -Also giving 10mmol of IV Na-phosphate  -f/u am phos   Hypotension: Resolved. BP this AM 114/65.   Hypokalemia: Improved. K+ 3.4<<3.4  Hypotonic hypovolemicHyponatremia: Resolved   Dispo: Anticipated discharge in approximately 1-2 day(s).   Yvette RackAgyei, Azarria Balint K, MD 08/26/2018, 1:48 PM Pager: [phone] IMTS PGY-1

## 2018-08-26 NOTE — Progress Notes (Signed)
Central tele called and said pt was off the monitor, found pt up in the room trying to walk to the bathroom- she was naked and had pulled off monitor and pulled IV out. Helped pt to bathroom as she was almost there. Soft stool cleaned and with steady put pt back to bed. Inst pt not to get up without calling nurse- monitor put on and unable to get IV,  order put in for IV team Bed alarm on.
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
## 2018-08-27 DIAGNOSIS — R32 Unspecified urinary incontinence: Secondary | ICD-10-CM

## 2018-08-27 DIAGNOSIS — R159 Full incontinence of feces: Secondary | ICD-10-CM

## 2018-08-27 DIAGNOSIS — L8990 Pressure ulcer of unspecified site, unspecified stage: Secondary | ICD-10-CM

## 2018-08-27 DIAGNOSIS — R410 Disorientation, unspecified: Secondary | ICD-10-CM

## 2018-08-27 LAB — BASIC METABOLIC PANEL
ANION GAP: 11 (ref 5–15)
BUN: 8 mg/dL (ref 6–20)
CHLORIDE: 95 mmol/L — AB (ref 98–111)
CO2: 28 mmol/L (ref 22–32)
Calcium: 8.7 mg/dL — ABNORMAL LOW (ref 8.9–10.3)
Creatinine, Ser: 0.73 mg/dL (ref 0.44–1.00)
GFR calc non Af Amer: >60 mL/min (ref >60)
Glucose, Bld: 76 mg/dL (ref 70–99)
Potassium: 2.8 mmol/L — ABNORMAL LOW (ref 3.5–5.1)
Sodium: 134 mmol/L — ABNORMAL LOW (ref 135–145)

## 2018-08-27 LAB — PHOSPHORUS: Phosphorus: 2.2 mg/dL — ABNORMAL LOW (ref 2.5–4.6)

## 2018-08-27 MED ORDER — ENOXAPARIN SODIUM 40 MG/0.4ML ~~LOC~~ SOLN
40.0000 mg | SUBCUTANEOUS | Status: DC
  Administered 2018-08-28 – 2018-08-30 (×2): 40 mg via SUBCUTANEOUS
  Filled 2018-08-27 (×2): qty 0.4

## 2018-08-27 MED ORDER — K PHOS MONO-SOD PHOS DI & MONO 155-852-130 MG PO TABS
500.0000 mg | ORAL_TABLET | Freq: Three times a day (TID) | ORAL | Status: AC
  Administered 2018-08-27: 500 mg via ORAL
  Filled 2018-08-27 (×3): qty 2

## 2018-08-27 MED ORDER — ENSURE ENLIVE PO LIQD
237.0000 mL | Freq: Two times a day (BID) | ORAL | Status: DC
  Administered 2018-08-28 – 2018-09-02 (×3): 237 mL via ORAL

## 2018-08-27 MED ORDER — POTASSIUM PHOSPHATE MONOBASIC 500 MG PO TABS: 500.0000 mg | ORAL_TABLET | Freq: Three times a day (TID) | ORAL | Status: DC

## 2018-08-27 MED ORDER — K PHOS MONO-SOD PHOS DI & MONO 155-852-130 MG PO TABS: 500.0000 mg | ORAL_TABLET | Freq: Three times a day (TID) | ORAL | Status: DC

## 2018-08-27 MED ORDER — QUETIAPINE FUMARATE 25 MG PO TABS
50.0000 mg | ORAL_TABLET | Freq: Every day | ORAL | Status: AC
  Administered 2018-08-27: 50 mg via ORAL
  Filled 2018-08-27: qty 2

## 2018-08-27 NOTE — Progress Notes (Signed)
Paged by RN for patient trying to leave and having ongoing agitation. On visit to patient's room she answered most questions stating she felt well, had no pain, with negative review of systems. However, she is confused as to where she is and why and who the people around her are. She alters between anger and being pleasant, and it is unclear if she is remembering from one moment to the next. It is currently taking her nurse and sitter to continuously reorient her. She is currently non-violent. I will order seroquel for now and will monitor.   Guinevere Scarlet A, DO 08/27/2018, 10:03 PM Pager: [phone]

## 2018-08-27 NOTE — Progress Notes (Addendum)
ED CSW faxed completed IVC paperwork to [facility]. Awaiting confirmation of receipt and approval of petition.   7:05 PM CSW received custody order via fax from the Magistrate. CSW placed Custody order and IVC paperwork on pt's chart. CSW called non emergency [employer] to serve pt.   Montine Circle, [facility]  [phone]

## 2018-08-27 NOTE — Progress Notes (Signed)
PT Cancellation Note  Patient Details Name: Sonya Cohen MRN: [id number] DOB: 1961/10/14   Cancelled Treatment:    Reason Eval/Treat Not Completed: (P) Medical issues which prohibited therapy Xray revealed distal fibular fx. Orthopedic consult ordered in ED, however did not happen. RN notified and will follow up on order. PT awaits consult for direction with weightbearing status prior to performing Evaluation for mobility needs. RN will page therapist once consult completed.  Geniva Lohnes B. Beverely Risen PT, DPT Acute Rehabilitation Services Pager [phone] Office [phone]     Elon Alas Fleet 08/27/2018, 9:17 AM

## 2018-08-27 NOTE — Progress Notes (Addendum)
Pt incontinent of urine and stool during night shift with complaints of abdominal pain. Pt refused to take scheduled PO phosphorus spitting out pills even after education was provided. RN tried putting pills in pudding and yogurt and pt still spit them out stating, "It was disgusting and she did not need any pills." Pt also refused 4 am dosage of ibuprofen and tylenol stating, "I don't want to be poisoned." RN notified IMTS night coverage and will continue to monitor pt.
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
## 2018-08-27 NOTE — Consult Note (Addendum)
Reason for Consult:Right ankle fx Referring Physician: E Florina Glas is an 57 y.o. female.  HPI: Sonya Cohen fell on 9/23 following a several day hx/o N/V. She had right ankle pain and came to the ED for evaluation. X-rays showed a right ankle fx and orthopedic surgery was paged but never called back. It seems no one followed up until today when we were consulted again. She notes no current pain in the ankle and said she hasn't been putting much weight on it. According to notes it seems she hasn't been putting any weight down on it. She has an odd affect, almost a degree of echolalia though her answers are consistent and rational. She is being worked up for this.  Past Medical History:  Diagnosis Date  . Hypertension     History reviewed. No pertinent surgical history.  History reviewed. No pertinent family history.  Social History:  reports that she has been smoking. She has never used smokeless tobacco. She reports that she drinks alcohol. She reports that she does not use drugs.  Allergies:  Allergies  Allergen Reactions  . Anesthetics, Ester Nausea And Vomiting    Anesthesia = Nausea and vomiting     Medications: I have reviewed the patient's current medications.  Results for orders placed or performed during the hospital encounter of 08/24/18 (from the past 48 hour(s))  Osmolality     Status: Abnormal   Collection Time: 08/25/18  4:38 PM  Result Value Ref Range   Osmolality 258 (L) 275 - 295 mOsm/kg    Comment: Performed at [facility], [place] [address], [place], [place] [address]  Magnesium     Status: None   Collection Time: 08/25/18  4:38 PM  Result Value Ref Range   Magnesium 2.1 1.7 - 2.4 mg/dL    Comment: Performed at [facility], [place] [address] [place], [place] [address]  Phosphorus     Status: Abnormal   Collection Time: 08/25/18  4:38 PM  Result Value Ref Range   Phosphorus 1.0 (LL) 2.5 - 4.6 mg/dL    Comment: CRITICAL RESULT CALLED  TO, READ BACK BY AND VERIFIED WITH: PACE [facility] 08/25/18 1740 WAYK Performed at [facility] [address] [place], [place] [address]   Basic metabolic panel     Status: Abnormal   Collection Time: 08/25/18  4:38 PM  Result Value Ref Range   Sodium 126 (L) 135 - 145 mmol/L   Potassium 3.0 (L) 3.5 - 5.1 mmol/L   Chloride 84 (L) 98 - 111 mmol/L   CO2 30 22 - 32 mmol/L   Glucose, Bld 87 70 - 99 mg/dL   BUN 14 6 - 20 mg/dL   Creatinine, Ser 0.83 0.44 - 1.00 mg/dL   Calcium 8.2 (L) 8.9 - 10.3 mg/dL   GFR calc non Af Amer >60 >60 mL/min   GFR calc Af Amer >60 >60 mL/min    Comment: (NOTE) The eGFR has been calculated using the CKD EPI equation. This calculation has not been validated in all clinical situations. eGFR's persistently <60 mL/min signify possible Chronic Kidney Disease.    Anion gap 12 5 - 15    Comment: Performed at [facility] [address] [place], [place] [address]  Basic metabolic panel     Status: Abnormal   Collection Time: 08/26/18  1:10 AM  Result Value Ref Range   Sodium 129 (L) 135 - 145 mmol/L   Potassium 3.4 (L)  3.5 - 5.1 mmol/L   Chloride 92 (L) 98 - 111 mmol/L   CO2 28 22 - 32 mmol/L   Glucose, Bld 83 70 - 99 mg/dL   BUN 12 6 - 20 mg/dL   Creatinine, Ser 0.76 0.44 - 1.00 mg/dL   Calcium 8.4 (L) 8.9 - 10.3 mg/dL   GFR calc non Af Amer >60 >60 mL/min   GFR calc Af Amer >60 >60 mL/min    Comment: (NOTE) The eGFR has been calculated using the CKD EPI equation. This calculation has not been validated in all clinical situations. eGFR's persistently <60 mL/min signify possible Chronic Kidney Disease.    Anion gap 9 5 - 15    Comment: Performed at [facility] [address], [place], [place] [address]  Basic metabolic panel     Status: Abnormal   Collection Time: 08/26/18  3:54 AM  Result Value Ref Range   Sodium 132 (L) 135 - 145 mmol/L   Potassium 3.4 (L) 3.5 - 5.1 mmol/L   Chloride 93 (L) 98 - 111 mmol/L   CO2 31 22 - 32  mmol/L   Glucose, Bld 79 70 - 99 mg/dL   BUN 12 6 - 20 mg/dL   Creatinine, Ser 0.80 0.44 - 1.00 mg/dL   Calcium 8.3 (L) 8.9 - 10.3 mg/dL   GFR calc non Af Amer >60 >60 mL/min   GFR calc Af Amer >60 >60 mL/min    Comment: (NOTE) The eGFR has been calculated using the CKD EPI equation. This calculation has not been validated in all clinical situations. eGFR's persistently <60 mL/min signify possible Chronic Kidney Disease.    Anion gap 8 5 - 15    Comment: Performed at [facility] [address], [place], [place] [address]  Phosphorus     Status: Abnormal   Collection Time: 08/26/18  9:26 PM  Result Value Ref Range   Phosphorus 1.8 (L) 2.5 - 4.6 mg/dL    Comment: Performed at [facility] [address] [place], [place] [address]  Phosphorus     Status: Abnormal   Collection Time: 08/27/18  3:34 AM  Result Value Ref Range   Phosphorus 2.2 (L) 2.5 - 4.6 mg/dL    Comment: Performed at [facility] [address] [place], [place] [address]  Basic metabolic panel     Status: Abnormal   Collection Time: 08/27/18  3:34 AM  Result Value Ref Range   Sodium 134 (L) 135 - 145 mmol/L   Potassium 2.8 (L) 3.5 - 5.1 mmol/L   Chloride 95 (L) 98 - 111 mmol/L   CO2 28 22 - 32 mmol/L   Glucose, Bld 76 70 - 99 mg/dL   BUN 8 6 - 20 mg/dL   Creatinine, Ser 0.73 0.44 - 1.00 mg/dL   Calcium 8.7 (L) 8.9 - 10.3 mg/dL   GFR calc non Af Amer >60 >60 mL/min   GFR calc Af Amer >60 >60 mL/min    Comment: (NOTE) The eGFR has been calculated using the CKD EPI equation. This calculation has not been validated in all clinical situations. eGFR's persistently <60 mL/min signify possible Chronic Kidney Disease.    Anion gap 11 5 - 15    Comment: Performed at [facility] [address], [place], [place] [address]    No results found.  Review of Systems  Constitutional: Negative for weight loss.  HENT: Negative for ear discharge, ear pain, hearing loss  and tinnitus.    Eyes: Negative for blurred vision, double vision, photophobia and pain.  Respiratory: Negative for cough, sputum production and shortness of breath.   Cardiovascular: Negative for chest pain.  Gastrointestinal: Negative for abdominal pain, nausea and vomiting.  Genitourinary: Negative for dysuria, flank pain, frequency and urgency.  Musculoskeletal: Negative for back pain, falls, joint pain, myalgias and neck pain.  Neurological: Negative for dizziness, tingling, sensory change, focal weakness, loss of consciousness and headaches.  Endo/Heme/Allergies: Does not bruise/bleed easily.  Psychiatric/Behavioral: Negative for depression, memory loss and substance abuse. The patient is not nervous/anxious.    Blood pressure 128/73, pulse 69, temperature 98.1 F (36.7 C), temperature source Oral, resp. rate (!) 23, height _0  (1.6 m), weight 76.2 kg, SpO2 100 %. Physical Exam  Constitutional: She appears well-developed and well-nourished. No distress.  HENT:  Head: Normocephalic and atraumatic.  Eyes: Conjunctivae are normal. Right eye exhibits no discharge. Left eye exhibits no discharge. No scleral icterus.  Neck: Normal range of motion.  Cardiovascular: Normal rate and regular rhythm.  Respiratory: Effort normal. No respiratory distress.  Musculoskeletal:  RLE No traumatic wounds, ecchymosis, or rash  Lower splint intact  No knee effusion  Knee stable to varus/ valgus and anterior/posterior stress  Sens DPN, SPN, TN intact  Motor EHL 5/5  Neurological: She is alert.  Skin: Skin is warm and dry. She is not diaphoretic.  Psychiatric: She has a normal mood and affect. Her behavior is normal.    Assessment/Plan: Right ankle fx -- Plan on ORIF tomorrow afternoon by Dr. Mardelle Matte. NPO after MN. NWB RLE and will continue to be NWB after surgery.    Lisette Abu, PA-C Orthopedic Surgery [phone] 08/27/2018, 1:45 PM   Discussed and agree with above.  Will see personally in preop  tomorrow.    Johnny Bridge, MD

## 2018-08-27 NOTE — Evaluation (Signed)
Occupational Therapy Evaluation Patient Details Name: Sonya Cohen MRN: [id number] DOB: 1960-12-04 Today's Date: 08/27/2018    History of Present Illness Ms. 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  She states she retired from her job 2 or 3 months ago and has been traveling and moving between [place], [place], and [place].  She states she just returned to [place] 1 to 2 weeks ago.  1 week ago she had sudden onset of nausea and vomiting.  This is associated with left lower quadrant pain and one episode of diarrhea.  Subsequently, she has been drinking lots of water and has experienced diaphoresis, lightheadedness, palpitations, lower extremity weakness, fatigue, and dyspnea.  She fell prior to admission at home because her legs felt weak and painful.  This resulted in right ankle pain.  In the ED, she was found to be hypertensive, hyponatremic, with a lactic acidosis, and a right distal fibula fracture, now splinted.   Clinical Impression   Pt was admitted as above and seen for initial OT assessment followed by participation in ADL retraining session. Pt is confused, decreased awareness of deficits and no family present to assist with determining PLOF/baseline status. She was found in bed by OT soaked in urine and naked, unaware. She will need 24/7 supervision/assist at d/c for safety.    Follow Up Recommendations  SNF;Supervision/Assistance - 24 hour    Equipment Recommendations  Other (comment)(Defer to next venue)    Recommendations for Other Services       Precautions / Restrictions Precautions Precautions: Fall Precaution Comments: R fibula fracture in ankle splint Restrictions Weight Bearing Restrictions: Yes      Mobility Bed Mobility Overal bed mobility: Needs Assistance Bed Mobility: Supine to Sit     Supine to sit: Mod assist;HOB elevated     General bed  mobility comments: Min-mod assist at trunk, verbal cues for LE's and to scoot to EOB in prep for ADL's  Transfers Overall transfer level: Needs assistance Equipment used: Rolling walker (2 wheeled) Transfers: Sit to/from [employer] Sit to Stand: Min assist Stand pivot transfers: Mod assist       General transfer comment: Consistent vc/tc's for safety with RW. Pt currently treated as NWB RLE however there are no WB orders in chart. Pt is unaware of deficits and safety is compromised.    Balance Overall balance assessment: Needs assistance Sitting-balance support: Feet supported;Single extremity supported Sitting balance-Leahy Scale: Fair     Standing balance support: Bilateral upper extremity supported Standing balance-Leahy Scale: Poor Standing balance comment: Decreased safety awareness and awareness of deficits noted using RW. Pt is confused, no family present for PLOF. Mod A for SPT from EOB to chair w/ mod-max vc's                           ADL either performed or assessed with clinical judgement   ADL Overall ADL's : Needs assistance/impaired Eating/Feeding: Set up;Sitting   Grooming: Wash/dry hands;Wash/dry face;Oral care;Set up;Sitting Grooming Details (indicate cue type and reason): Sitting up in recliner chair Upper Body Bathing: Min guard;Set up;Sitting Upper Body Bathing Details (indicate cue type and reason): VC's for sequencing as pt only washed her face initially. Lower Body Bathing: Moderate assistance;Cueing for safety;Sitting/lateral leans;Sit to/from stand Lower Body Bathing Details (indicate cue type and reason): Pt educated to NWB on RLE, used RW and Min-Mod A in standing  to bathe peri-area/buttocks. Seated for upper leg/thigh areas. Upper Body Dressing : Set up;Min guard;Sitting   Lower Body Dressing: Minimal assistance;Sitting/lateral leans;Sit to/from stand   Toilet Transfer: Stand-pivot;Moderate assistance;Cueing for  safety;Cueing for sequencing;RW;BSC(Simulated transfer from EOB to recliner chair)   Toileting- Clothing Manipulation and Hygiene: Moderate assistance;Cueing for safety;Cueing for sequencing;Sitting/lateral lean;Sit to/from stand Toileting - Clothing Manipulation Details (indicate cue type and reason): Using RW     Functional mobility during ADLs: Moderate assistance;Cueing for safety;Cueing for sequencing;Rolling walker(Pt unaware of deficits, confused. Requires consistent vc/tc for RW use and safety. Need WB precautions orders in chart, kept NWB during OT assessment) General ADL Comments: Pt was seen for initial OT assessment followed by participation in ADL retraining session. Pt is confused and no family present to assist with determining PLOF/baseline status. She was found in bed soaked in urine and naked, unaware. She will need 24/7 supervision/assist at d/c for safety.     Vision Baseline Vision/History: Wears glasses Wears Glasses: Reading only Patient Visual Report: No change from baseline(Per pt report)       Perception     Praxis      Pertinent Vitals/Pain Pain Assessment: No/denies pain Pain Score: 0-No pain     Hand Dominance Right   Extremity/Trunk Assessment Upper Extremity Assessment Upper Extremity Assessment: Generalized weakness(RHD)   Lower Extremity Assessment Lower Extremity Assessment: Defer to PT evaluation   Cervical / Trunk Assessment Cervical / Trunk Assessment: Normal   Communication Communication Communication: No difficulties   Cognition Arousal/Alertness: Awake/alert Behavior During Therapy: Anxious;WFL for tasks assessed/performed Overall Cognitive Status: Difficult to assess                                 General Comments: Difficult to assess, no family present. Does not provide clear and decisive answers to PLOF, living situation. States she has been independent while here in hospital for ADL's but chart review indicates  otherwise. Pt was found naked in bed this morning and was soaked in urine, unaware. Unaware of deficits.   General Comments       Exercises     Shoulder Instructions      Home Living Family/patient expects to be discharged to:: Private residence(Pt unable to state living conditions, states she doesn't know where she will be going, she's figuring it out. Appears confused, mentions that she may go live with her mother (first states [place], then later states [place]). Current status at d/c is unknown.) Living Arrangements: Alone Available Help at Discharge: (Unknown)                                    Prior Functioning/Environment Level of Independence: Independent(Per pt report)        Comments: Pt unable to clearly state PLOF. States "I do fine, I'm fine" reposts being independent in hospital but was found naked in bed this morning soaked in urine and unaware. Currently unaware of deficits.        OT Problem List: Decreased strength;Decreased knowledge of use of DME or AE;Decreased cognition;Pain;Decreased knowledge of precautions;Decreased safety awareness;Impaired balance (sitting and/or standing)      OT Treatment/Interventions: Self-care/ADL training;DME and/or AE instruction;Therapeutic activities;Cognitive remediation/compensation;Patient/family education(Balance to be addressed in context of ADL's)    OT Goals(Current goals can be found in the care plan section) Acute Rehab OT Goals Patient Stated Goal: "Figure out  d/c plan"  OT Goal Formulation: Patient unable to participate in goal setting Time For Goal Achievement: 09/10/18 Potential to Achieve Goals: Good  OT Frequency: Min 2X/week   Barriers to D/C: Other (comment)  Pt unable to state d/c plans, lives alone, unable to state where. Says she's "figuring it out" mentions possibly living with her mother in [place] and later states [place].       Co-evaluation              AM-PAC PT "6 Clicks" Daily Activity      Outcome Measure Help from another person eating meals?: None Help from another person taking care of personal grooming?: A Little Help from another person toileting, which includes using toliet, bedpan, or urinal?: A Lot Help from another person bathing (including washing, rinsing, drying)?: A Little Help from another person to put on and taking off regular upper body clothing?: A Little Help from another person to put on and taking off regular lower body clothing?: A Lot 6 Click Score: 17   End of Session Equipment Utilized During Treatment: Gait belt;Rolling walker Nurse Communication: Mobility status  Activity Tolerance: Patient tolerated treatment well Patient left: in chair;with call bell/phone within reach;with chair alarm set  OT Visit Diagnosis: Unsteadiness on feet (R26.81);Muscle weakness (generalized) (M62.81);History of falling (Z91.81);Other symptoms and signs involving cognitive function                Time: 4098-1191 OT Time Calculation (min): 34 min Charges:  OT General Charges $OT Visit: 1 Visit OT Evaluation $OT Eval Moderate Complexity: 1 Mod OT Treatments $Self Care/Home Management : 8-22 mins    Barnhill, Amy Beth Dixon, OTR/L 08/27/2018, 9:02 AM

## 2018-08-27 NOTE — Progress Notes (Signed)
Pt pulled her IV out, took off her gown, and is trying to leave the hospital . Pt pushed the NT and walked down the hall and said she was leaving "bye bye"  Convinced her to sit in wheelchair and safely took her back to her room where she continues to try to leave. Security was call and IMTS paged.  Pt friend Wynona Canes came to visit and to calm her down.  Pt Ex-husband has also been called by family to come help calm her down.  Pt is good friends with her Ex husband.  Pt son lives in [place] and is unable to come. IVC paperwork has been started.

## 2018-08-27 NOTE — Progress Notes (Addendum)
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  RD spoke with patient today. She is resting in her recliner. Reports she ate breakfast this AM. No % PO intake records available. She was experiencing a decreased appetite PTA. Was eating 2 meals per day.  Patient also endorses a 30 lb weight loss (15%) x 2 months. Severe for time frame. She is amenable to drinking strawberry Ensure Enlive during acute stay. Labs & medications reviewed. Na 134 (L). K 2.8 (L). Phos 2.2 (L).  Orthopedics note reviewed. Plan for ORIF tomorrow.  During RD visit/interview patient would suddenly start laughing.  NUTRITION - FOCUSED PHYSICAL EXAM:  Completed. No muscle or fat depletions noticed.  Diet Order:   Diet Order            Diet regular Room service appropriate? Yes; Fluid consistency: Thin  Diet effective now             EDUCATION NEEDS:   No education needs have been identified at this time  Skin:  Skin Assessment: Skin Integrity Issues: Skin Integrity Issues:: Stage I Stage I: sacrum  Last BM:  9/25  Height:   Ht Readings from Last 1 Encounters:  08/24/18 5\' 3"  (1.6 m)   Weight:   Wt  Readings from Last 1 Encounters:  08/25/18 76.2 kg   BMI:  Body mass index is 29.76 kg/m.  Estimated Nutritional Needs:   Kcal:  1900-2100  Protein:  95-110 gm  Fluid:  1.9-2.1 L  Maureen Chatters, RD, LDN Pager #: [phone] After-Hours Pager #: [phone]

## 2018-08-27 NOTE — Progress Notes (Addendum)
Subjective:   Overnight: Reported urinary and bowel incontinence by nurse, refuse medication   Sonya Cohen was examined and evaluated at bedside this AM. She was observed sleeping comfortably in bed and whispering during history taking. States she slept well and everything is fine. Denies any headache, light-headedness, blurry vision. She is urinating and eliminating without problem. Denies any F/N/V/D/C. She states her ankle pain is well controlled. She reports she does not remember refusing her night time medication. When asked about her overnight confusion, she states 'that could have very well happened because "I was upset." She is unable to recall why she was upset last night. Confirms she will take her medications.    Objective:  Vital signs in last 24 hours: Vitals:   08/26/18 2216 08/26/18 2306 08/27/18 0512 08/27/18 1100  BP: 139/85 (!) 126/98 136/86 128/73  Pulse:    69  Resp:      Temp:  97.8 F (36.6 C) (!) 97.5 F (36.4 C) 98.1 F (36.7 C)  TempSrc:  Oral Oral Oral  SpO2: 100% 100% 100% 100%  Weight:      Height:       Const: In NAD, lying comfortably in bed, whispering during assessment CV: RRR, no MGR. R Rep: CTABL, no wheezes, crackles rhonchi Abd: BS+, NTTP Neuro: AAOx3, able to recall all 3 words at 1 and 5 minutes.   Assessment/Plan:  Active Problems:   Hyponatremia   Hypokalemia   Non-intractable vomiting with nausea   Lower abdominal pain   Closed fracture of right distal fibula   Right leg pain   Head injury   Cervical strain   Pain  [name] a 57 year old woman with medical history significant for hypertension, melanomas/pexcision, left buttock squamous cell carcinoma in situs/pexcision6/2016,osteoarthritis, depression and generalized anxiety disorder who is being managed hyponatremia which has resolved, several electrolyte abnormalities and right distal fibula fracture following a ground-level fall.  Right distal fibula  fractures/p GLF: Denies RLE pain and states the current pain regimen is providing relief. PT was unable to evaluate patient as they needed clearance from orthopedic surgery. On speaking to orthopedic surgery PA, she is to remain non weight bearing on the RLE and will most likely require surgical intervention.  -Pain regimen: Schedule Tylenol + motrin q6  Behavioral health: There is still ongoing reports of nighttime confusion and disorientation by the nursing staff.  I subsequently ended up speaking to the son, Sonya Alexanders who made me aware that toward the end of July, his mom retired from her 38-year job and cashed out her 401(k).  She has been living in a hotel since.  Per his knowledge, she has had 4 episodes of nighttime confusion since August and each time law enforcement has been contacted.  The episodes are listed below: 1.  Found walking at [facility]  2.  Drove her car to [place] until she ran out of gas and was found sleeping in her car 3.  Found walking barefoot at night with no shoes near [employer] 4.  Was found multiple times trying to walk into neighbors rooms at her hotel  The son also indicated that her sister called her this morning and Sonya Cohen had nonsensical speech where she was asking for [place] toast however that was not the central theme of their conversation.   Even though she is alert and oriented to place, time and person and has passed her mini cognitive test, I am still concerned about an underlying behavioral issue, possibly  Bipolar Disorder given her history of depression or early dementia as her Moca score measured 18/30. - Begin work-up for early dementia by obtaining TSH, RPR, vitamin B12 - Consult psychiatry to evaluate patient for capacity   Electrolytes:  Hypophosphatemia: Improving  Hypokalemia: K+ 2.8 -Continue KPhos 500mg  x 3 doses -F/u Phosphorus, BMP  Dispo: Anticipated discharge in approximately 3-4 day(s).   Yvette Rack,  MD 08/27/2018, 1:01 PM Pager: [phone] IMTS PGY-1

## 2018-08-27 NOTE — Progress Notes (Signed)
Notified IMTS that pt continues to refuse to wear beside cardiac monitoring and continues to take off leads anytime RN or nurse tech tries to put leads back on pt.  Pt continues to be agitated and refuses to wear hospital gown. Pt stated to sitter that she was leaving and attempted to walk into the hallway without any clothes on. Paged IMTS to come the bedside.

## 2018-08-28 ENCOUNTER — Encounter (HOSPITAL_COMMUNITY)
Admission: EM | Disposition: A | Discharge status: Skilled Nursing Facility | Payer: Self-pay | Source: Home / Self Care | Attending: Internal Medicine

## 2018-08-28 DIAGNOSIS — R456 Violent behavior: Secondary | ICD-10-CM

## 2018-08-28 DIAGNOSIS — G4700 Insomnia, unspecified: Secondary | ICD-10-CM

## 2018-08-28 DIAGNOSIS — R451 Restlessness and agitation: Secondary | ICD-10-CM

## 2018-08-28 LAB — BASIC METABOLIC PANEL
Anion gap: 13 (ref 5–15)
Anion gap: 12 (ref 5–15)
BUN: 9 mg/dL (ref 6–20)
BUN: 8 mg/dL (ref 6–20)
CHLORIDE: 95 mmol/L — AB (ref 98–111)
CHLORIDE: 100 mmol/L (ref 98–111)
CO2: 27 mmol/L (ref 22–32)
CO2: 24 mmol/L (ref 22–32)
CREATININE: 0.74 mg/dL (ref 0.44–1.00)
Calcium: 9.3 mg/dL (ref 8.9–10.3)
Calcium: 8.6 mg/dL — ABNORMAL LOW (ref 8.9–10.3)
Creatinine, Ser: 0.82 mg/dL (ref 0.44–1.00)
GFR calc Af Amer: >60 mL/min (ref >60)
GFR calc non Af Amer: >60 mL/min (ref >60)
GLUCOSE: 101 mg/dL — AB (ref 70–99)
GLUCOSE: 67 mg/dL — AB (ref 70–99)
Potassium: 2.5 mmol/L — CL (ref 3.5–5.1)
Potassium: 3.6 mmol/L (ref 3.5–5.1)
SODIUM: 135 mmol/L (ref 135–145)
Sodium: 136 mmol/L (ref 135–145)

## 2018-08-28 LAB — PHOSPHORUS: Phosphorus: 2.7 mg/dL (ref 2.5–4.6)

## 2018-08-28 LAB — ETHANOL: Alcohol, Ethyl (B): <10 mg/dL (ref <10)

## 2018-08-28 LAB — GAMMA GT: GGT: 68 U/L — AB (ref 7–50)

## 2018-08-28 LAB — MAGNESIUM: Magnesium: 2 mg/dL (ref 1.7–2.4)

## 2018-08-28 SURGERY — OPEN REDUCTION INTERNAL FIXATION (ORIF) ANKLE FRACTURE
Anesthesia: Choice | Laterality: Right

## 2018-08-28 MED ORDER — POTASSIUM CHLORIDE CRYS ER 20 MEQ PO TBCR
40.0000 meq | EXTENDED_RELEASE_TABLET | Freq: Three times a day (TID) | ORAL | Status: AC
  Administered 2018-08-28 – 2018-08-29 (×3): 40 meq via ORAL
  Filled 2018-08-28 (×4): qty 2

## 2018-08-28 MED ORDER — HALOPERIDOL LACTATE 5 MG/ML IJ SOLN
0.5000 mg | Freq: Once | INTRAMUSCULAR | Status: AC
  Administered 2018-08-28: 0.5 mg via INTRAMUSCULAR
  Filled 2018-08-28: qty 1

## 2018-08-28 MED ORDER — POTASSIUM CHLORIDE 10 MEQ/100ML IV SOLN
INTRAVENOUS | Status: AC
  Filled 2018-08-28: qty 100

## 2018-08-28 MED ORDER — HALOPERIDOL 0.5 MG PO TABS: 0.5000 mg | ORAL_TABLET | Freq: Once | ORAL | Status: DC

## 2018-08-28 MED ORDER — POTASSIUM CHLORIDE 10 MEQ/100ML IV SOLN
10.0000 meq | INTRAVENOUS | Status: AC
  Administered 2018-08-28 (×5): 10 meq via INTRAVENOUS
  Filled 2018-08-28 (×4): qty 100

## 2018-08-28 NOTE — Progress Notes (Addendum)
Patient ID: Sonya Cohen, female   DOB: 1961/07/21, 57 y.o.   MRN: [id number]  Noted events of last night. Spoke with pt this morning who is Ox4 but also already thought she had surgery this morning. After that was explained she decided she wanted to have surgery later but after she left and came back. Clearly some degree of disordered thinking. Discussed with house staff who would prefer to wait on ORIF at least until after psych eval which I think is reasonable as this is not an emergency. Will likely push surgery into next week. Will give diet today.    Freeman Caldron, PA-C Orthopedic Surgery [phone]   Seen and agree with above.   Eulas Post, MD
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
## 2018-08-28 NOTE — Progress Notes (Signed)
   Subjective:   Overnight:  -Insomnia, agitated, violent -s/p Seroquel and low dose haldol  -Placed on IVC (24 hour hold)  Sonya Cohen was examined and evaluated at bedside this AM. She states "everything's good. Everything is great." Complains she was suppose to get out of the hospital an hour ago. Asked her if we can get blood work again but she states they got all the blood tests they need. She agrees to just 1 more blood test. No acute complaints at this time.  Objective:  Vital signs in last 24 hours: Vitals:   08/27/18 2023 08/28/18 0400 08/28/18 0437 08/28/18 0807  BP: 130/83 (!) 148/88  104/71  Pulse:  (!) 116    Resp: 18   20  Temp: 98.2 F (36.8 C)  98 F (36.7 C)   TempSrc: Oral  Axillary   SpO2: 100%     Weight:      Height:       Physical Exam  Constitutional: She is oriented to person, place, and time and well-developed, well-nourished, and in no distress.  Eyes:  No Kayser-Fleischer rings  Cardiovascular: Normal rate, regular rhythm and normal heart sounds. Exam reveals no gallop.  No murmur heard. Pulmonary/Chest: Effort normal and breath sounds normal.  Neurological: She is alert and oriented to person, place, and time.  Psychiatric: Her affect is inappropriate. She is concerned with wish fulfillment. She exhibits disordered thought content.    Assessment/Plan:  Active Problems:   Hyponatremia   Hypokalemia   Non-intractable vomiting with nausea   Lower abdominal pain   Closed fracture of right distal fibula   Right leg pain   Head injury   Cervical strain   Pain   Pressure injury of skin  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   Right distal fibula fractures/p WGN:FAOZ has been postponed until next week given current confusion and disorientation.  -Keep splint in place -Pain regimen: Schedule Tylenol + motrin q6  Behavioral health: She was noted to be obviously agitated with confusion overnight. She was found naked and wandering in the unit hallway and pacing in her room. She did require re-orientation and at one point, the nurse reports of violent behavior. She also took off her tele monitor leads. The night team administered low dose Seroquel and Haldol with no improvement. She was made IVC after the son was contacted. This morning, she was found in the hallway with hospital security staff. After much orientation, she agreed to go back to her room. She remained calm as I played her favorite song. She has moments where she will just smile and say "I am fine, Sonya Cohen is out there, Sonya Cohen is coming to get me." Her ongoing symptoms are concerning for a personality disorder and I will attempt to consult psychiatry again.  -Consult psychiatry to evaluate patient for capacity  -Follow up TSH, RPR, vitamin B12  Electrolytes: f/u am bmp, phos  Hypokalemia: Hypokalemia [Repleting]  Dispo: Anticipated discharge in approximately 3-4 day(s).   Sonya Rack, MD 08/28/2018, 8:23 AM Pager: [phone] IMTS PGY-1

## 2018-08-28 NOTE — Evaluation (Signed)
Physical Therapy Evaluation Patient Details Name: Austin Pongratz MRN: [id number] DOB: 1961/04/16 Today's Date: 08/28/2018   History of Present Illness  Ms. 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  She states she retired from her job 2 or 3 months ago and has been traveling and moving between [place], [place], and [place].  She states she just returned to [place] 1 to 2 weeks ago.  1 week ago she had sudden onset of nausea and vomiting.  This is associated with left lower quadrant pain and one episode of diarrhea.  Subsequently, she has been drinking lots of water and has experienced diaphoresis, lightheadedness, palpitations, lower extremity weakness, fatigue, and dyspnea.  She felt prior to admission at home because her legs felt weak and painful.  This resulted in right ankle pain.  In the ED, she was found to be hypertensive, hyponatremic, with a lactic acidosis, and a right distal fibula fracture, now splinted.  Clinical Impression  Pt continues to be confused this am. Surgical ORIF has been postponed secondary to pt's mental status. Pt oriented x4 however not clear on whether surgery has happened or not. Pt unable to provide any information about where she is going to go at discharge. Pt currently limited in safe mobility by inability to maintain NWB through R LE. Pt able to state she should not put weight through her foot but in standing has difficulty maintaining. Pt currently supervision for bed mobility and minA for transfers, min A for 1x hop forward however unable to maintain precautions. Pt d/c plans are unclear at the current time PT recommends PT therapy at final d/c location. PT will continue to follow acutely.     Follow Up Recommendations Other (comment)(waiting on psych consult for possible IVC )    Equipment Recommendations  Rolling walker with 5" wheels       Precautions  / Restrictions Precautions Precautions: Fall Restrictions Weight Bearing Restrictions: Yes RLE Weight Bearing: Non weight bearing(pt able to state that she is non weightbearing on her R LE)      Mobility  Bed Mobility Overal bed mobility: Needs Assistance Bed Mobility: Sit to Supine       Sit to supine: Supervision   General bed mobility comments: supervision for safety with LE management back to bed  Transfers Overall transfer level: Needs assistance Equipment used: Rolling walker (2 wheeled) Transfers: Sit to/from Stand Sit to Stand: Min assist         General transfer comment: minA for power up and steadying, vc for NWB, instruction for keeping foot aout in front of her to maintain.  Ambulation/Gait Ambulation/Gait assistance: Min assist Gait Distance (Feet): 1 Feet Assistive device: Rolling walker (2 wheeled) Gait Pattern/deviations: Step-to pattern;Trunk flexed(hop to pattern ) Gait velocity: slowed Gait velocity interpretation: <1.31 ft/sec, indicative of household ambulator General Gait Details: min A for steadying, vc for hopping with use of UE on RW, unable to maintain so sat back down with assist        Balance Overall balance assessment: Needs assistance Sitting-balance support: Feet supported;Single extremity supported Sitting balance-Leahy Scale: Fair     Standing balance support: Bilateral upper extremity supported Standing balance-Leahy Scale: Poor Standing balance comment: Decreased safety awareness and awareness of deficits noted using RW. Pt is confused, no family present for PLOF. Mod A for SPT from EOB to chair w/ mod-max vc's  Pertinent Vitals/Pain Pain Assessment: No/denies pain    Home Living Family/patient expects to be discharged to:: Private residence(Pt unable to state living conditions, states she doesn't know where she will be going, ) Living Arrangements: Alone                     Prior Function Level of Independence: Independent         Comments: Unable to clearly state PLOF, continues to be confused, taking her clothes off and deficating/urinating without asking to go to bathroom Unaware of her deficits        Extremity/Trunk Assessment   Upper Extremity Assessment Upper Extremity Assessment: Overall WFL for tasks assessed    Lower Extremity Assessment Lower Extremity Assessment: RLE deficits/detail RLE Deficits / Details: R ankle fracture, currently splinted,  hip ROM and strength grossly 4/5, knee ROM limited by weight of splint, strength grossly 3+/5  RLE: Unable to fully assess due to immobilization       Communication   Communication: No difficulties  Cognition Arousal/Alertness: Awake/alert Behavior During Therapy: Anxious;WFL for tasks assessed/performed Overall Cognitive Status: Difficult to assess                                 General Comments: Unable to clearly state PLOF or living situation. Currently awaiting psych consult for IVC       General Comments General comments (skin integrity, edema, etc.): RN entered room while pt sitting EoB and reported pt K+ low and she needed to start IV, assisted pt back to bed and ended session        Assessment/Plan    PT Assessment Patient needs continued PT services  PT Problem List Decreased strength;Decreased balance;Decreased mobility;Decreased activity tolerance;Decreased cognition;Decreased safety awareness;Decreased knowledge of use of DME;Decreased knowledge of precautions       PT Treatment Interventions DME instruction;Gait training;Functional mobility training;Therapeutic activities;Therapeutic exercise;Balance training;Cognitive remediation;Patient/family education    PT Goals (Current goals can be found in the Care Plan section)  Acute Rehab PT Goals Patient Stated Goal: get out of here PT Goal Formulation: With patient Time For Goal Achievement:  09/11/18 Potential to Achieve Goals: Fair    Frequency Min 2X/week   Barriers to discharge Decreased caregiver support         AM-PAC PT "6 Clicks" Daily Activity  Outcome Measure Difficulty turning over in bed (including adjusting bedclothes, sheets and blankets)?: A Little Difficulty moving from lying on back to sitting on the side of the bed? : A Little Difficulty sitting down on and standing up from a chair with arms (e.g., wheelchair, bedside commode, etc,.)?: Unable Help needed moving to and from a bed to chair (including a wheelchair)?: A Little Help needed walking in hospital room?: A Lot Help needed climbing 3-5 steps with a railing? : Total 6 Click Score: 13    End of Session Equipment Utilized During Treatment: Gait belt Activity Tolerance: Patient tolerated treatment well Patient left: in bed;with call bell/phone within reach;with nursing/sitter in room Nurse Communication: Mobility status PT Visit Diagnosis: Unsteadiness on feet (R26.81);Other abnormalities of gait and mobility (R26.89);Muscle weakness (generalized) (M62.81);Difficulty in walking, not elsewhere classified (R26.2);Other symptoms and signs involving the nervous system (Z61.096)    Time: 0454-0981 PT Time Calculation (min) (ACUTE ONLY): 12 min   Charges:   PT Evaluation $PT Eval Moderate Complexity: 1 Mod          Elon Alas  Fleet PT, DPT Acute Rehabilitation Services Pager [phone] Office [phone]   Elon Alas Fleet 08/28/2018, 10:40 AM

## 2018-08-28 NOTE — Progress Notes (Signed)
Patient stated that it is ok to speak with sister Harley Hallmark regarding her care. Also, has a friend Wynona Canes who is local that we may contact if we need something emergently. I have reached out to IMTS and Ortho and made both aware that the family is upset and would like to have updates on care.

## 2018-08-28 NOTE — Progress Notes (Signed)
I have called Sonya Cohen, her sister, and spoken with her and updated her on all of the clinical situation.  In short, she has a right ankle fracture that needs surgery, and a state of confusion that has been subacute, but potentially progressive, unclear etiology.  On exam, she is splinted, sensation intact throughout the toes, EHL and FHL are intact, she has a well-healed surgical wound from a total knee replacement done by another orthopedist over the right knee.  Impression right ankle fracture with displacement, delirium unclear etiology  She is going to undergo psychiatric and medical evaluation, and if optimized plan for possible surgery sometime next week.  Her son also may elect for her to undergo surgical intervention even if she is not able to get mentally improved, the entire situation is challenging because she is probably not capable of being compliant with nonweightbearing status, but we will have to do our best.  Dr. Nicki Guadalajara will plan to check in on Monday to evaluate her capacity for surgery, she may have surgery sometime next week if she has improved from a cognitive standpoint.  Eulas Post, MD

## 2018-08-28 NOTE — Progress Notes (Signed)
Lab woke up pt to do morning labs around 4 am and pt refused. Sitter called RN into room. RN observed pt only saying numbers and letters sitting on the side of the bed without clothing on. Pt would not answer any orientation questions. RN notified IMTS and waited at the bedside for night coverage to arrive.

## 2018-08-29 DIAGNOSIS — F4325 Adjustment disorder with mixed disturbance of emotions and conduct: Secondary | ICD-10-CM

## 2018-08-29 DIAGNOSIS — F17210 Nicotine dependence, cigarettes, uncomplicated: Secondary | ICD-10-CM

## 2018-08-29 DIAGNOSIS — L8990 Pressure ulcer of unspecified site, unspecified stage: Secondary | ICD-10-CM

## 2018-08-29 DIAGNOSIS — F99 Mental disorder, not otherwise specified: Secondary | ICD-10-CM

## 2018-08-29 LAB — BASIC METABOLIC PANEL
Anion gap: 8 (ref 5–15)
BUN: 6 mg/dL (ref 6–20)
CALCIUM: 8.5 mg/dL — AB (ref 8.9–10.3)
CHLORIDE: 99 mmol/L (ref 98–111)
CO2: 27 mmol/L (ref 22–32)
Creatinine, Ser: 0.77 mg/dL (ref 0.44–1.00)
GFR calc Af Amer: >60 mL/min (ref >60)
GFR calc non Af Amer: >60 mL/min (ref >60)
GLUCOSE: 82 mg/dL (ref 70–99)
Potassium: 3.7 mmol/L (ref 3.5–5.1)
Sodium: 134 mmol/L — ABNORMAL LOW (ref 135–145)

## 2018-08-29 LAB — TSH: TSH: 3.484 u[IU]/mL (ref 0.350–4.500)

## 2018-08-29 LAB — RPR: RPR Ser Ql: NONREACTIVE

## 2018-08-29 LAB — VITAMIN B12: VITAMIN B 12: 675 pg/mL (ref 180–914)

## 2018-08-29 LAB — PHOSPHORUS: Phosphorus: 2.4 mg/dL — ABNORMAL LOW (ref 2.5–4.6)

## 2018-08-29 MED ORDER — POTASSIUM PHOSPHATE MONOBASIC 500 MG PO TABS
500.0000 mg | ORAL_TABLET | Freq: Once | ORAL | Status: DC
  Filled 2018-08-29 (×2): qty 1

## 2018-08-29 MED ORDER — ACETAMINOPHEN 325 MG PO TABS
650.0000 mg | ORAL_TABLET | Freq: Four times a day (QID) | ORAL | Status: DC | PRN
  Administered 2018-08-30 – 2018-09-04 (×11): 650 mg via ORAL
  Filled 2018-08-29 (×11): qty 2

## 2018-08-29 MED ORDER — IBUPROFEN 200 MG PO TABS
400.0000 mg | ORAL_TABLET | Freq: Four times a day (QID) | ORAL | Status: DC | PRN
  Administered 2018-08-29 – 2018-09-02 (×5): 400 mg via ORAL
  Filled 2018-08-29 (×5): qty 2

## 2018-08-29 NOTE — Progress Notes (Signed)
   Subjective:   Overnight: No acute events reported   Today, Ms.Febo was examined at bedside and reports she is doing well. She seems to be in a much better mood today than she has been previously. Still continues requires re-direction and has nonsensical speech but no signs of visual or auditory hallucination. Currently denies nausea, vomiting, headache, blurry vision. Her RLE pain is well controlled on her current pain regimen.   Objective:  Vital signs in last 24 hours: Vitals:   08/28/18 1724 08/28/18 2339 08/29/18 0456 08/29/18 0717  BP: 136/90 140/72  (!) 145/73  Pulse: 87 73  77  Resp:  15  16  Temp:  98.4 F (36.9 C)  98.4 F (36.9 C)  TempSrc:  Oral  Oral  SpO2: 100% 100%  100%  Weight:   62.1 kg   Height:       .Physical Exam  Constitutional: She is oriented to person, place, and time. She appears not lethargic, not dehydrated and not jaundiced. She appears not cachectic. No distress.  Cardiovascular: Normal rate, regular rhythm and normal heart sounds.  Pulmonary/Chest: Effort normal and breath sounds normal. No respiratory distress. She has no wheezes.  Abdominal: Bowel sounds are normal.  Neurological: She is oriented to person, place, and time. She appears not lethargic.  Psychiatric: Her affect is inappropriate. Her affect is not blunt. She is not agitated. She does not express impulsivity. She is not apathetic and not concerned with wish fulfillment. She exhibits disordered thought content. She has a flat affect.    Assessment/Plan:  Active Problems:   Hyponatremia   Hypokalemia   Non-intractable vomiting with nausea   Lower abdominal pain   Closed fracture of right distal fibula   Right leg pain   Head injury   Cervical strain   Pain   Pressure injury of skin  [name] a 57 year old woman with medical history significant for hypertension, melanomas/pexcision, left buttock squamous cell carcinoma in  situs/pexcision6/2016,osteoarthritis, depression and generalized anxiety disorderwho is being managed hyponatremia which has resolved, several electrolyte abnormalities, right distal fibula fracture following a ground-level fall, confusion and disorientation.   Right distal fibula fractures/p GLF:Her RLE pain is well controlled on combination of scheduled tylenol and motrin. She is well aware not to put weight on the leg. Orthopedic surgery has been following and has had ongoing conversation with son as she does not have capacity to make medication decisions due to ongoing probable psychosis/behavioral changes.  -Ortho will re-evaluate patient on Monday and decide on surgery   Behavioral health:Overnight, she remained calm and there were no complains of agitation, confusion or disorientation. This morning, she was in a better mood and more calm than previously. Given her undulating degree of behavioral changes in combination with flat affect, disorganized thought process, somewhat disheveled and her history of depression. I suspect a degree of bipolar disorder vs schizophrenia.  -IVC can be renewed if necessary  -Awaiting psychiatry evaluation.   Hypertension: Will not resume home HCTZ and monitor for now.    Electrolytes:f/u am bmp, phos  Hypokalemia:Resolved Hypophosphatemia: Improved - Repleting with KPhos 500mg  x 1 dose   Dispo: Anticipated discharge in approximately 3-4 day(s).    Yvette Rack, MD 08/29/2018, 11:14 AM Pager: [phone] IMTS PGY-1

## 2018-08-29 NOTE — Consult Note (Addendum)
[facility] Consult   Reason for Consult:  Agitation, confusion Referring Physician:  Dr Lynnae January Patient Identification: Sonya Cohen MRN:  [id number] Principal Diagnosis: adjustment disorder with Diagnosis:   Patient Active Problem List   Diagnosis Date Noted  . Adjustment disorder with mixed disturbance of emotions and conduct [F43.25] 08/29/2018    Priority: High  . Pressure injury of skin [L89.90] 08/27/2018  . Pain [R52]   . Hyponatremia [E87.1] 08/24/2018  . Hypokalemia [E87.6]   . Non-intractable vomiting with nausea [R11.2]   . Lower abdominal pain [R10.30]   . Closed fracture of right distal fibula [Q75.916B]   . Right leg pain [M79.604]   . Head injury [S09.90XA]   . Cervical strain [S16.1XXA]     Total Time spent with patient: 45 minutes  Subjective:   Sonya Cohen is a 57 y.o. female patient is alert and oriented times 3 with no confusion or agitation on assessment.  Reports a lack of sleep prior to admission, only getting 2-3 hours of sleep when she typically gets 8-10 hours.  Sleep has improved since admission and today appears to be at her baseline this afternoon.  PRN Zyprexa 5 mg BID for agitation.  Dr Dwyane Dee reviewed this patient and concurs with the plan.  HPI:  57 yo female who was consulted for aggression and confusion at times.  This symptoms have dissipated and she is clear and coherent.  No suicidal/homicidal ideations, hallucinations, or substance abuse besides cannabis use.  Her electrolytes were slightly imbalanced which may have potentiated her symptoms along with lack of sleep and recent head injury.  Past Psychiatric History: None  Risk to Self:  none Risk to Others:  none Prior Inpatient Therapy:  none Prior Outpatient Therapy:  none  Past Medical History:  Past Medical History:  Diagnosis Date  . Hypertension    History reviewed. No pertinent surgical history. Family History: History reviewed. No pertinent family  history. Family Psychiatric  History: none Social History:  Social History   Substance and Sexual Activity  Alcohol Use Yes  . Frequency: Never   Comment: Daily beer     Social History   Substance and Sexual Activity  Drug Use Never    Social History   Socioeconomic History  . Marital status: Legally Separated    Spouse name: Not on file  . Number of children: Not on file  . Years of education: Not on file  . Highest education level: Not on file  Occupational History  . Not on file  Social Needs  . Financial resource strain: Not on file  . Food insecurity:    Worry: Not on file    Inability: Not on file  . Transportation needs:    Medical: Not on file    Non-medical: Not on file  Tobacco Use  . Smoking status: Current Every Day Smoker  . Smokeless tobacco: Never Used  Substance and Sexual Activity  . Alcohol use: Yes    Frequency: Never    Comment: Daily beer  . Drug use: Never  . Sexual activity: Not on file  Lifestyle  . Physical activity:    Days per week: Not on file    Minutes per session: Not on file  . Stress: Not on file  Relationships  . Social connections:    Talks on phone: Not on file    Gets together: Not on file    Attends religious service: Not on file    Active member of club or  organization: Not on file    Attends meetings of clubs or organizations: Not on file    Relationship status: Not on file  Other Topics Concern  . Not on file  Social History Narrative  . Not on file   Additional Social History:    Allergies:   Allergies  Allergen Reactions  . Anesthetics, Ester Nausea And Vomiting    Anesthesia = Nausea and vomiting     Labs:  Results for orders placed or performed during the hospital encounter of 08/24/18 (from the past 48 hour(s))  Ethanol     Status: None   Collection Time: 08/28/18  7:56 AM  Result Value Ref Range   Alcohol, Ethyl (B) <10 <10 mg/dL    Comment: (NOTE) Lowest detectable limit for serum alcohol is  10 mg/dL. For medical purposes only. Performed at [facility], [place] [address] [place], [place] [address]   Gamma GT     Status: Abnormal   Collection Time: 08/28/18  7:56 AM  Result Value Ref Range   GGT 68 (H) 7 - 50 U/L    Comment: Performed at [facility], [place] [address], [place], [place] [address]  Basic metabolic panel     Status: Abnormal   Collection Time: 08/28/18  7:56 AM  Result Value Ref Range   Sodium 135 135 - 145 mmol/L   Potassium 2.5 (LL) 3.5 - 5.1 mmol/L    Comment: CRITICAL RESULT CALLED TO, READ BACK BY AND VERIFIED WITH: Volney Presser [id number] [facility]    Chloride 95 (L) 98 - 111 mmol/L   CO2 27 22 - 32 mmol/L   Glucose, Bld 101 (H) 70 - 99 mg/dL   BUN 9 6 - 20 mg/dL   Creatinine, Ser 0.82 0.44 - 1.00 mg/dL   Calcium 9.3 8.9 - 10.3 mg/dL   GFR calc non Af Amer >60 >60 mL/min   GFR calc Af Amer >60 >60 mL/min    Comment: (NOTE) The eGFR has been calculated using the CKD EPI equation. This calculation has not been validated in all clinical situations. eGFR's persistently <60 mL/min signify possible Chronic Kidney Disease.    Anion gap 13 5 - 15    Comment: Performed at [facility] [address] [place], [place] [address]  Phosphorus     Status: None   Collection Time: 08/28/18  7:56 AM  Result Value Ref Range   Phosphorus 2.7 2.5 - 4.6 mg/dL    Comment: Performed at [facility] [address], [place], [place] [address]  Magnesium     Status: None   Collection Time: 08/28/18  7:56 AM  Result Value Ref Range   Magnesium 2.0 1.7 - 2.4 mg/dL    Comment: Performed at [facility] [address], [place], [place] [address]  Basic metabolic panel     Status: Abnormal   Collection Time: 08/28/18  5:43 PM  Result Value Ref Range   Sodium 136 135 - 145 mmol/L   Potassium 3.6 3.5 - 5.1 mmol/L   Chloride 100 98 - 111 mmol/L   CO2 24 22 - 32 mmol/L   Glucose, Bld 67 (L) 70 - 99 mg/dL   BUN 8 6 - 20 mg/dL   Creatinine,  Ser 0.74 0.44 - 1.00 mg/dL   Calcium 8.6 (L) 8.9 - 10.3 mg/dL   GFR calc non Af Amer >60 >60 mL/min   GFR calc Af Amer >60 >60 mL/min  Comment: (NOTE) The eGFR has been calculated using the CKD EPI equation. This calculation has not been validated in all clinical situations. eGFR's persistently <60 mL/min signify possible Chronic Kidney Disease.    Anion gap 12 5 - 15    Comment: Performed at [facility] [address], [place], [place] [address]  Vitamin B12     Status: None   Collection Time: 08/29/18  1:11 AM  Result Value Ref Range   Vitamin B-12 675 180 - 914 pg/mL    Comment: (NOTE) This assay is not validated for testing neonatal or myeloproliferative syndrome specimens for Vitamin B12 levels. Performed at [facility], [place] [address] [place], [place] [address]   TSH     Status: None   Collection Time: 08/29/18  1:11 AM  Result Value Ref Range   TSH 3.484 0.350 - 4.500 uIU/mL    Comment: Performed by a 3rd Generation assay with a functional sensitivity of <=0.01 uIU/mL. Performed at [facility], [place] [address], [place], [place] [address]   Basic metabolic panel     Status: Abnormal   Collection Time: 08/29/18  1:11 AM  Result Value Ref Range   Sodium 134 (L) 135 - 145 mmol/L   Potassium 3.7 3.5 - 5.1 mmol/L   Chloride 99 98 - 111 mmol/L   CO2 27 22 - 32 mmol/L   Glucose, Bld 82 70 - 99 mg/dL   BUN 6 6 - 20 mg/dL   Creatinine, Ser 0.77 0.44 - 1.00 mg/dL   Calcium 8.5 (L) 8.9 - 10.3 mg/dL   GFR calc non Af Amer >60 >60 mL/min   GFR calc Af Amer >60 >60 mL/min    Comment: (NOTE) The eGFR has been calculated using the CKD EPI equation. This calculation has not been validated in all clinical situations. eGFR's persistently <60 mL/min signify possible Chronic Kidney Disease.    Anion gap 8 5 - 15    Comment: Performed at [facility] [address], [place], [place] [address]  Phosphorus     Status: Abnormal   Collection Time:  08/29/18  1:11 AM  Result Value Ref Range   Phosphorus 2.4 (L) 2.5 - 4.6 mg/dL    Comment: Performed at [facility] [address], [place], [place] [address]    Current Facility-Administered Medications  Medication Dose Route Frequency Provider Last Rate Last Dose  . acetaminophen (TYLENOL) tablet 650 mg  650 mg Oral Q6H PRN Bloomfield, Carley D, DO      . enoxaparin (LOVENOX) injection 40 mg  40 mg Subcutaneous Q24H Agyei, Obed K, MD   40 mg at 08/28/18 1734  . feeding supplement (ENSURE ENLIVE) (ENSURE ENLIVE) liquid 237 mL  237 mL Oral BID BM Bartholomew Crews, MD   237 mL at 08/29/18 0921  . ibuprofen (ADVIL,MOTRIN) tablet 400 mg  400 mg Oral Q6H PRN Bloomfield, Carley D, DO   400 mg at 08/29/18 1444  . Influenza vac split quadrivalent PF (FLUARIX) injection 0.5 mL  0.5 mL Intramuscular Tomorrow-1000 Agyei, Obed K, MD      . potassium phosphate (monobasic) (K-PHOS ORIGINAL) tablet 500 mg  500 mg Oral Once Jean Rosenthal, MD        Musculoskeletal: Strength & Muscle Tone: within normal limits Gait & Station: normal Patient leans: N/A  Psychiatric Specialty Exam: Physical Exam  Nursing note and vitals reviewed. Constitutional: She is oriented to person, place, and time. She  appears well-developed and well-nourished.  HENT:  Head: Normocephalic.  Neck: Normal range of motion.  Respiratory: Effort normal.  Neurological: She is alert and oriented to person, place, and time.  Psychiatric: She has a normal mood and affect. Her speech is normal and behavior is normal. Judgment and thought content normal. Cognition and memory are normal.    Review of Systems  All other systems reviewed and are negative.   Blood pressure (!) 145/91, pulse 72, temperature 98.3 F (36.8 C), temperature source Oral, resp. rate 16, height 5' 3"  (1.6 m), weight 62.1 kg, SpO2 100 %.Body mass index is 24.27 kg/m.  General Appearance: Casual  Eye Contact:  Good  Speech:  Normal Rate  Volume:   Normal  Mood:  Euthymic  Affect:  Congruent  Thought Process:  Coherent and Descriptions of Associations: Intact  Orientation:  Full (Time, Place, and Person)  Thought Content:  WDL and Logical  Suicidal Thoughts:  No  Homicidal Thoughts:  No  Memory:  Immediate;   Good Recent;   Good Remote;   Good  Judgement:  Fair  Insight:  Fair  Psychomotor Activity:  Normal  Concentration:  Concentration: Good and Attention Span: Good  Recall:  Good  Fund of Knowledge:  Good  Language:  Good  Akathisia:  No  Handed:  Right  AIMS (if indicated):     Assets:  Leisure Time Resilience Social Support  ADL's:  Impaired  Cognition:  WNL  Sleep:        Treatment Plan Summary: ADjustment disorder with mixed disturbance of emotions and conduct: Zyprexa 5 mg BID PRN agitation recommended  Disposition: No evidence of imminent risk to self or others at present.    Waylan Boga, NP 08/29/2018 5:06 PM

## 2018-08-30 LAB — CULTURE, BLOOD (ROUTINE X 2)
CULTURE: NO GROWTH
Culture: NO GROWTH
SPECIAL REQUESTS: ADEQUATE
Special Requests: ADEQUATE

## 2018-08-30 LAB — BASIC METABOLIC PANEL
Anion gap: 9 (ref 5–15)
BUN: 7 mg/dL (ref 6–20)
CALCIUM: 8.9 mg/dL (ref 8.9–10.3)
CO2: 25 mmol/L (ref 22–32)
Chloride: 100 mmol/L (ref 98–111)
Creatinine, Ser: 0.7 mg/dL (ref 0.44–1.00)
GFR calc Af Amer: >60 mL/min (ref >60)
Glucose, Bld: 82 mg/dL (ref 70–99)
Potassium: 4.3 mmol/L (ref 3.5–5.1)
Sodium: 134 mmol/L — ABNORMAL LOW (ref 135–145)

## 2018-08-30 LAB — PHOSPHORUS: Phosphorus: 1.7 mg/dL — ABNORMAL LOW (ref 2.5–4.6)

## 2018-08-30 MED ORDER — K PHOS MONO-SOD PHOS DI & MONO 155-852-130 MG PO TABS
500.0000 mg | ORAL_TABLET | Freq: Three times a day (TID) | ORAL | Status: AC
  Administered 2018-08-30 (×2): 500 mg via ORAL
  Filled 2018-08-30 (×3): qty 2

## 2018-08-30 MED ORDER — OXYCODONE HCL 5 MG PO TABS
5.0000 mg | ORAL_TABLET | Freq: Once | ORAL | Status: AC | PRN
  Administered 2018-08-30: 5 mg via ORAL
  Filled 2018-08-30: qty 1

## 2018-08-30 MED ORDER — OXYCODONE HCL 5 MG PO TABS
5.0000 mg | ORAL_TABLET | Freq: Four times a day (QID) | ORAL | Status: AC | PRN
  Administered 2018-08-30 – 2018-08-31 (×4): 5 mg via ORAL
  Filled 2018-08-30 (×5): qty 1

## 2018-08-30 MED ORDER — OLANZAPINE 5 MG PO TABS
5.0000 mg | ORAL_TABLET | Freq: Two times a day (BID) | ORAL | Status: DC | PRN
  Administered 2018-08-30 – 2018-09-04 (×3): 5 mg via ORAL
  Filled 2018-08-30 (×7): qty 1

## 2018-08-30 MED ORDER — PANTOPRAZOLE SODIUM 40 MG PO TBEC
40.0000 mg | DELAYED_RELEASE_TABLET | Freq: Every day | ORAL | Status: DC
  Administered 2018-08-30 – 2018-09-03 (×4): 40 mg via ORAL
  Filled 2018-08-30 (×5): qty 1

## 2018-08-30 MED ORDER — POTASSIUM PHOSPHATE MONOBASIC 500 MG PO TABS
500.0000 mg | ORAL_TABLET | Freq: Three times a day (TID) | ORAL | Status: DC
  Filled 2018-08-30 (×2): qty 1

## 2018-08-30 MED ORDER — CALCIUM CARBONATE ANTACID 500 MG PO CHEW
1.0000 | CHEWABLE_TABLET | Freq: Once | ORAL | Status: AC
  Administered 2018-08-30: 200 mg via ORAL
  Filled 2018-08-30: qty 1

## 2018-08-30 NOTE — Plan of Care (Signed)
Pt with improved anxiety this shift, following commands, appropriate conversation.  Appears calm, is cooperative.

## 2018-08-30 NOTE — Progress Notes (Signed)
Pt c/o pain to RLE, received tylenol PRN with no relief.  Now rating pain "11/10."  On call provider notified.  Awaiting call back.    0300 - provider called back, while on the phone, pt refused Ibuprofen.    0308 - Oxycodone administered.  Will reassess pain.    0400- RLE pain persists at 7/10.  Provider notified via page.             Oxycodone 5mg  repeat dose ordered.   0600 - pt sleeping.  Appears comfortable.

## 2018-08-30 NOTE — Progress Notes (Signed)
Subjective:   Overnight: Patient complained of RLE pain overnight, receive pain medication with improvement of pain.  Patient was seen resting comfortably in her bed this morning. She states she had pain over night (as above) but was feeling better this morning; because of this she did not get much sleep and was just this morning getting rest. She denies any pain fevers or shortness of  Breath. She was informed her labs a are improving but we still need to correct some electrolytes with medications. She is agreeable and and has no questions this morning.  We await attending cosign on psychiatry consult.  Objective:  Vital signs in last 24 hours: Vitals:   08/29/18 1654 08/29/18 1959 08/30/18 0000 08/30/18 0800  BP: (!) 145/91 (!) 150/88 128/71 126/71  Pulse: 72 (!) 123 73 62  Resp:  (!) 26 17 14   Temp: 98.3 F (36.8 C) 99 F (37.2 C) 98.7 F (37.1 C) 97.7 F (36.5 C)  TempSrc: Oral Oral Oral Oral  SpO2: 100% 100% 100% 100%  Weight:      Height:       .Physical Exam  Constitutional: She is oriented to person, place, and time. She appears not lethargic, not dehydrated and not jaundiced. She appears not cachectic. No distress.  Cardiovascular: Normal rate, regular rhythm and normal heart sounds.  Pulmonary/Chest: Effort normal and breath sounds normal. No respiratory distress. She has no wheezes.  Abdominal: Bowel sounds are normal.  Neurological: She is oriented to person, place, and time. She appears not lethargic.  Psychiatric: Her affect is inappropriate. Her affect is not blunt. She is not agitated. She is not apathetic. She exhibits ordered thought content. She has a flat affect.   Assessment/Plan:  Active Problems:   Hyponatremia   Hypokalemia   Non-intractable vomiting with nausea   Lower abdominal pain   Closed fracture of right distal fibula   Right leg pain   Head injury   Cervical strain   Pain   Pressure injury of skin   Adjustment disorder with mixed  disturbance of emotions and conduct  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   Right distal fibula fractures/p GLF:Her RLE pain is well controlled on combination of scheduled tylenol and motrin. Som pain overnight but this improved with additional doses. She has been told not to put weight on the leg. Orthopedic surgery has been following and has had ongoing conversations. Will re-evaluate patient this week for surgery. Psychiatry did not make a comment on capacity. - Ortho will re-evaluate patient on Monday and decide on surgery   Behavioral health:Overnight, she remained calm and there were no complains of agitation, confusion or disorientation. This morning, she was in a good mood and calm. Given her undulating degree of behavioral changes in combination with flat affect, disorganized thought process, somewhat disheveled and her history of depression, a degree of bipolar disorder vs schizophrenia.  - IVC can be renewed if necessary  - Psychiatry reccommend Zyprexa BID PRN, for adjustment disorder - Continue to monitor   Hypertension: Will not resume home HCTZ and monitor for now.   Electrolytes:f/u am bmp, phos  Hypokalemia:Resolved Hypophosphatemia: Improving - Repleting with KPhos 500mg  x 3 dose   Dispo: Anticipated discharge in approximately 2-3 day(s).   Beola Cord,  MD 08/30/2018, 9:58 AM Pager: [phone]

## 2018-08-31 ENCOUNTER — Other Ambulatory Visit: Payer: Self-pay | Admitting: Orthopaedic Surgery

## 2018-08-31 LAB — RENAL FUNCTION PANEL
ANION GAP: 8 (ref 5–15)
Albumin: 3.3 g/dL — ABNORMAL LOW (ref 3.5–5.0)
BUN: 6 mg/dL (ref 6–20)
CHLORIDE: 101 mmol/L (ref 98–111)
CO2: 28 mmol/L (ref 22–32)
Calcium: 9.4 mg/dL (ref 8.9–10.3)
Creatinine, Ser: 0.76 mg/dL (ref 0.44–1.00)
GFR calc Af Amer: >60 mL/min (ref >60)
GFR calc non Af Amer: >60 mL/min (ref >60)
Glucose, Bld: 79 mg/dL (ref 70–99)
POTASSIUM: 4.4 mmol/L (ref 3.5–5.1)
Phosphorus: 3.8 mg/dL (ref 2.5–4.6)
Sodium: 137 mmol/L (ref 135–145)

## 2018-08-31 LAB — CREATININE, SERUM
CREATININE: 0.8 mg/dL (ref 0.44–1.00)
GFR calc Af Amer: >60 mL/min (ref >60)

## 2018-08-31 LAB — MAGNESIUM: Magnesium: 2 mg/dL (ref 1.7–2.4)

## 2018-08-31 MED ORDER — CEFAZOLIN SODIUM-DEXTROSE 2-4 GM/100ML-% IV SOLN
2.0000 g | INTRAVENOUS | Status: AC
  Administered 2018-09-01: 2 g via INTRAVENOUS
  Filled 2018-08-31: qty 100

## 2018-08-31 MED ORDER — ENOXAPARIN SODIUM 40 MG/0.4ML ~~LOC~~ SOLN: 40.0000 mg | SUBCUTANEOUS | Status: DC

## 2018-08-31 NOTE — Progress Notes (Addendum)
   Subjective:  Overnight: No acute events.   Today, was examined at bedside and was observed lying comfortably in bed. She reports that she is doing "terrific." In regards to her RLE pain, she reports that it is relieved with heating pads and her current pain medicine. She denies nausea, vomiting, abdominal pain and is tolerating PO intake with no difficulties. She has received update from orthopedic surgery regarding her planned ORIF. All her concerns were appropriately addressed.   Objective:  Vital signs in last 24 hours: Vitals:   08/30/18 1648 08/30/18 2307 08/31/18 0450 08/31/18 0624  BP: 125/76 (!) 144/85  100/74  Pulse: 63 67  76  Resp: 18 20  16   Temp: 98.2 F (36.8 C) 97.6 F (36.4 C)  98.2 F (36.8 C)  TempSrc: Oral Oral  Oral  SpO2: 100% 100%  100%  Weight:   54.9 kg   Height:       Const: in NAD, lying comfortably in bed  CV: RRR, no MGR Resp: CTABL, no wheezes, crackles, rhonchi  Abd: BS+, NTTP Ext: RLE in splint   Assessment/Plan:  Principal Problem:   Closed fracture of right distal fibula Active Problems:   Hyponatremia   Hypokalemia   Non-intractable vomiting with nausea   Lower abdominal pain   Right leg pain   Head injury   Cervical strain   Pressure injury of skin   Adjustment disorder with mixed disturbance of emotions and conduct  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  Right distal fibula fractures/p GLF:Her pain is stable this morning. Overnight, she required a one time dose of Oxycodone 10mg . RLE continues to remain in a splint. This morning, she was briefed by orthopedic  surgery regarding planned ORIF sometime this week. She might possibly have osteoporosis given that her fracture was a resultant of a ground level fall. Her calcium has normalized and I will check for Vitamin D, ionized calcium as well. She will benefit from a bisphosphonate therapy but will hold until 2-4 weeks after discharge  -Appreciate ortho recommendation.  -Continue Oxy IR 5mg  q6 hrs prn severe pain, Tylenol and motrin 16 hrs prn pain  Adjustment disorder with mixed disturbance of emotions and conduct: There have been no reprots of agitation, confusion, disorientation, bowel or urinary incontinence overnight. Remains alert and oriented to place, time and person. Per psychiatry evaluation, she does not require in-patient psychiatry care as she is not suicidal, homicidal or psychotic.  -Continue Zyprexa 5mg  BID prn agitation.   Social: Prior to admission, she was living in a motel and I'm uncomfortable sending her back to the motel after her ORIF due to concern of behavioral changes. I will have a discussion with his son today regarding postdischarge plans  Dispo: Anticipated discharge in approximately 2-3 day(s).   Yvette Rack, MD 08/31/2018, 9:50 AM Pager: [phone] IMT[facility] PGY-1

## 2018-08-31 NOTE — Progress Notes (Signed)
Update:   I was able to speak to the son Jill Alexanders this afternoon to update him on his mom's medical condition. I thoroughly explained to him regarding psychiatry's evaluation and the new medication that was prescribed (Zyprexa) as needed for agitation. He expressed gratitude and understanding. I also made him aware that she has been alert and oriented to place, time and person throughout her admission, however in the past 24 hours, there have been no calls from housing staff pertaining to her being confused or agitated.   Medically, her electrolytes have normalized and I do not for-see any hindrance what will prevent her from proceeding with surgery.   Per evaluation by psychiatry, there was no specific comment on capacity but as I mentioned earlier in my previous note, she is alert and oriented x3 and seems coherent as she appropriately answers assessment questionnaire and follows commands.   Jodelle Red, MD IMTS PGY-1

## 2018-08-31 NOTE — Progress Notes (Signed)
Physical Therapy Treatment Patient Details Name: Sonya Cohen MRN: [id number] DOB: Jun 03, 1961 Today's Date: 08/31/2018    History of Present Illness Ms. 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  She states she retired from her job 2 or 3 months ago and has been traveling and moving between [place], [place], and [place].  She states she just returned to [place] 1 to 2 weeks ago.  1 week ago she had sudden onset of nausea and vomiting.  This is associated with left lower quadrant pain and one episode of diarrhea.  Subsequently, she has been drinking lots of water and has experienced diaphoresis, lightheadedness, palpitations, lower extremity weakness, fatigue, and dyspnea.  She felt prior to admission at home because her legs felt weak and painful.  This resulted in right ankle pain.  In the ED, she was found to be hypertensive, hyponatremic, with a lactic acidosis, and a right distal fibula fracture, now splinted.    PT Comments    Pt agreeable to working with therapy today. Although pt is able to repeat back that she is not to put weight through her R LE pt has difficulty maintaining NWB due to decreased strength and decreased awareness. Pt had increased R LE pain with placing limb in dependent position so worked on deep breathing as a way to decrease anxiety and pain.  Pt continues to be unable to maintain NWB on R LE due to decreased strength and awareness of when she is putting weight on her R foot. Given pt's decreased caregiver support and inability to maintain NWB, PT recommends SNF level care at d/c.    Follow Up Recommendations  SNF     Equipment Recommendations  Other (comment)(TBD at next venue)       Precautions / Restrictions Precautions Precautions: Fall Restrictions Weight Bearing Restrictions: Yes RLE Weight Bearing: Non weight bearing(pt able to state that she is  non weightbearing on her R LE)    Mobility  Bed Mobility Overal bed mobility: Needs Assistance Bed Mobility: Sit to Supine     Supine to sit: Supervision Sit to supine: Supervision   General bed mobility comments: supervision for safety with LE management back to bed  Transfers Overall transfer level: Needs assistance Equipment used: Rolling walker (2 wheeled) Transfers: Sit to/from Stand Sit to Stand: Min guard         General transfer comment: min guard for safety, vc for maintaining R LE NWB,   Ambulation/Gait             General Gait Details: did not attempt due to inability to maintain NWB       Balance Overall balance assessment: Needs assistance Sitting-balance support: Feet supported;Single extremity supported Sitting balance-Leahy Scale: Fair     Standing balance support: Bilateral upper extremity supported Standing balance-Leahy Scale: Poor                              Cognition Arousal/Alertness: Awake/alert Behavior During Therapy: Anxious;Impulsive Overall Cognitive Status: Difficult to assess                                 General Comments: pt continues to prefer not to wear gown appropriately      Exercises General Exercises - Lower Extremity Long Arc Quad: Right;AROM;10 reps;Seated Hip Flexion/Marching: AROM;Right;10  reps;Seated Other Exercises Other Exercises: 10x sit<>stand with RW to increase L LE strength to be able to maintain NWB on R LE    General Comments General comments (skin integrity, edema, etc.): VSS      Pertinent Vitals/Pain Pain Assessment: Faces Faces Pain Scale: Hurts even more Pain Location: R ankle Pain Descriptors / Indicators: Grimacing;Guarding Pain Intervention(s): Repositioned;Monitored during session;Limited activity within patient's tolerance;Utilized relaxation techniques           PT Goals (current goals can now be found in the care plan section) Acute Rehab PT  Goals Patient Stated Goal: get out of here PT Goal Formulation: With patient Time For Goal Achievement: 09/11/18 Potential to Achieve Goals: Fair Progress towards PT goals: Progressing toward goals    Frequency    Min 2X/week      PT Plan Discharge plan needs to be updated       AM-PAC PT "6 Clicks" Daily Activity  Outcome Measure  Difficulty turning over in bed (including adjusting bedclothes, sheets and blankets)?: A Little Difficulty moving from lying on back to sitting on the side of the bed? : A Little Difficulty sitting down on and standing up from a chair with arms (e.g., wheelchair, bedside commode, etc,.)?: Unable Help needed moving to and from a bed to chair (including a wheelchair)?: A Little Help needed walking in hospital room?: A Lot Help needed climbing 3-5 steps with a railing? : Total 6 Click Score: 13    End of Session Equipment Utilized During Treatment: Gait belt Activity Tolerance: Patient tolerated treatment well Patient left: in bed;with call bell/phone within reach;with nursing/sitter in room Nurse Communication: Mobility status PT Visit Diagnosis: Unsteadiness on feet (R26.81);Other abnormalities of gait and mobility (R26.89);Muscle weakness (generalized) (M62.81);Difficulty in walking, not elsewhere classified (R26.2);Other symptoms and signs involving the nervous system (Z61.096)     Time: 0454-0981 PT Time Calculation (min) (ACUTE ONLY): 10 min  Charges:  $Therapeutic Exercise: 8-22 mins                     Amere Iott B. Beverely Risen PT, DPT Acute Rehabilitation Services Pager [phone] Office [phone]    Elon Alas Fleet 08/31/2018, 11:38 AM

## 2018-08-31 NOTE — Consult Note (Signed)
Reason for Consult: Unstable ankle fracture Referring Physician: Dr. Carter Kitten  Sonya Cohen is an 57 y.o. female.  HPI: Patient had a fall at home.  She had immediate pain and deformity of her ankle.  She was seen in the emergency department and diagnosed with a distal fibula fracture with medial clear space widening.  She was admitted to the hospital due to agitation and potential psych issues.  Since her admission she has been improving with regard to pain in her ankle.  She has been medically stable.  Psychiatry evaluated her over the weekend and providers are awaiting final recommendations.  Prior to admission she was living in a motel.  This morning patient reports well-controlled pain.  She feels like she is doing much better.  She understands that she needs surgery and is amenable to this.  She denies any numbness or tingling.  Denies any other joint or extremity pain.  Past Medical History:  Diagnosis Date  . Hypertension     History reviewed. No pertinent surgical history.  History reviewed. No pertinent family history.  Social History:  reports that she has been smoking. She has never used smokeless tobacco. She reports that she drinks alcohol. She reports that she does not use drugs.  Allergies:  Allergies  Allergen Reactions  . Anesthetics, Ester Nausea And Vomiting    Anesthesia = Nausea and vomiting     Medications: I have reviewed the patient's current medications.  Results for orders placed or performed during the hospital encounter of 08/24/18 (from the past 48 hour(s))  Basic metabolic panel     Status: Abnormal   Collection Time: 08/30/18  2:52 AM  Result Value Ref Range   Sodium 134 (L) 135 - 145 mmol/L   Potassium 4.3 3.5 - 5.1 mmol/L   Chloride 100 98 - 111 mmol/L   CO2 25 22 - 32 mmol/L   Glucose, Bld 82 70 - 99 mg/dL   BUN 7 6 - 20 mg/dL   Creatinine, Ser 0.70 0.44 - 1.00 mg/dL   Calcium 8.9 8.9 - 10.3 mg/dL   GFR calc non Af Amer >60 >60  mL/min   GFR calc Af Amer >60 >60 mL/min    Comment: (NOTE) The eGFR has been calculated using the CKD EPI equation. This calculation has not been validated in all clinical situations. eGFR's persistently <60 mL/min signify possible Chronic Kidney Disease.    Anion gap 9 5 - 15    Comment: Performed at [facility] [address], [place], [place] [address]  Phosphorus     Status: Abnormal   Collection Time: 08/30/18  2:52 AM  Result Value Ref Range   Phosphorus 1.7 (L) 2.5 - 4.6 mg/dL    Comment: Performed at [facility] [address] [place], [place] [address]  Creatinine, serum     Status: None   Collection Time: 08/31/18  4:32 AM  Result Value Ref Range   Creatinine, Ser 0.80 0.44 - 1.00 mg/dL   GFR calc non Af Amer >60 >60 mL/min   GFR calc Af Amer >60 >60 mL/min    Comment: (NOTE) The eGFR has been calculated using the CKD EPI equation. This calculation has not been validated in all clinical situations. eGFR's persistently <60 mL/min signify possible Chronic Kidney Disease. Performed at [facility], [place] [address] [place], [place] [address]   Renal function panel     Status: Abnormal   Collection Time: 08/31/18  4:32 AM  Result Value Ref Range   Sodium 137 135 - 145 mmol/L   Potassium 4.4 3.5 - 5.1 mmol/L   Chloride 101 98 - 111 mmol/L   CO2 28 22 - 32 mmol/L   Glucose, Bld 79 70 - 99 mg/dL   BUN 6 6 - 20 mg/dL   Creatinine, Ser 0.76 0.44 - 1.00 mg/dL   Calcium 9.4 8.9 - 10.3 mg/dL   Phosphorus 3.8 2.5 - 4.6 mg/dL   Albumin 3.3 (L) 3.5 - 5.0 g/dL   GFR calc non Af Amer >60 >60 mL/min   GFR calc Af Amer >60 >60 mL/min    Comment: (NOTE) The eGFR has been calculated using the CKD EPI equation. This calculation has not been validated in all clinical situations. eGFR's persistently <60 mL/min signify possible Chronic Kidney Disease.    Anion gap 8 5 - 15    Comment: Performed at [facility] [address] [place], [place]  [address]  Magnesium     Status: None   Collection Time: 08/31/18  4:32 AM  Result Value Ref Range   Magnesium 2.0 1.7 - 2.4 mg/dL    Comment: Performed at [facility] [address] [place], [place] [address]    No results found.  Review of Systems  Constitutional: Negative.   HENT: Negative.   Eyes: Negative.   Respiratory: Negative.   Gastrointestinal: Negative.   Musculoskeletal:       Pain in the right ankle.  Skin: Negative.   Neurological: Negative.    Blood pressure 100/74, pulse 76, temperature 98.2 F (36.8 C), temperature source Oral, resp. rate 16, height 5' 3"  (1.6 m), weight 54.9 kg, SpO2 100 %. Physical Exam  Constitutional: She appears well-nourished.  HENT:  Head: Normocephalic.  Eyes: Conjunctivae are normal.  Neck: Neck supple.  Cardiovascular: Normal rate.  Respiratory: Effort normal.  GI: Soft.  Musculoskeletal:  Right ankle in the splint.  Toes are warm and well-perfused.  She is able to wiggle her toes.  She endorses sensation about the toes.  She has some swelling of the leg proximally.  No pain to palpation of the knee.  No pain with logroll.  No tenderness palpation of other extremities.  Neurological: She is alert.  Skin: Skin is warm.     Imaging: Right ankle shows distal fibula fracture Weber the type.  There is medial clear space widening with some avulsion from the deltoid ligament.  Assessment/Plan: I had a lengthy conversation today with the patient.  Would recommend open treatment for her ankle as it appears unstable.  Will try to schedule prior to discharge once cleared for surgery from medical and psych standpoint.    Continue NWB RLE. Continue Lovenox for DVT prophylaxis Pain control.  Erle Crocker 08/31/2018, 10:56 AM

## 2018-08-31 NOTE — NC FL2 (Addendum)
[place] MEDICAID FL2 LEVEL OF CARE SCREENING TOOL     IDENTIFICATION  Patient Name: Sonya Cohen Birthdate: [birth year] Sex: female Admission Date (Current Location): 08/24/2018  [place] and [place] Number:  Producer, television/film/video and Address:  [facility] [place], [address] [place], [place] [address]      Provider Number: [id number]  Attending Physician Name and Address:  Burns Spain, MD  Relative Name and Phone Number:  Harley Hallmark, sister, [phone]    Current Level of Care: Hospital Recommended Level of Care: Skilled Nursing Facility Prior Approval Number:    Date Approved/Denied:   PASRR Number: pending  Discharge Plan: SNF    Current Diagnoses: Patient Active Problem List   Diagnosis Date Noted  . 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 Pressure injury of skin 08/27/2018  . Hyponatremia 08/24/2018  . Hypokalemia   . Non-intractable vomiting with nausea   . Lower abdominal pain   . Closed fracture of right distal fibula   . Right leg pain   . Head injury   . Cervical strain     Orientation RESPIRATION BLADDER Height & Weight     Self, Time, Place  Normal Continent Weight: 121 lb (54.9 kg) Height:  5\' 3"  (160 cm)  BEHAVIORAL SYMPTOMS/MOOD NEUROLOGICAL BOWEL NUTRITION STATUS      Continent Diet  AMBULATORY STATUS COMMUNICATION OF NEEDS Skin   Extensive Assist Verbally Skin abrasions, Surgical wounds                       Personal Care Assistance Level of Assistance  Bathing, Feeding, Dressing Bathing Assistance: Maximum assistance Feeding assistance: Independent Dressing Assistance: Maximum assistance     Functional Limitations Info  Sight, Hearing, Speech Sight Info: Adequate Hearing Info: Adequate Speech Info: Adequate    SPECIAL CARE FACTORS FREQUENCY  PT (By licensed PT), OT (By licensed OT)     PT Frequency: 5x week OT Frequency: 5x week             Contractures Contractures Info: Not present    Additional Factors Info  Code Status, Allergies, Psychotropic Code Status Info: Full Code Allergies Info: ANESTHETICS, ESTER  Psychotropic Info: Zyprexa 5 mg BID PRN          Current Medications (08/31/2018):  This is the current hospital active medication list Current Facility-Administered Medications  Medication Dose Route Frequency Provider Last Rate Last Dose  . acetaminophen (TYLENOL) tablet 650 mg  650 mg Oral Q6H PRN Lenward Chancellor D, DO   650 mg at 08/30/18 2215  . [START ON 09/02/2018] enoxaparin (LOVENOX) injection 40 mg  40 mg Subcutaneous Q24H Helberg, Justin, MD      . feeding supplement (ENSURE ENLIVE) (ENSURE ENLIVE) liquid 237 mL  237 mL Oral BID BM Burns Spain, MD   237 mL at 08/29/18 0921  . ibuprofen (ADVIL,MOTRIN) tablet 400 mg  400 mg Oral Q6H PRN Bloomfield, Carley D, DO   400 mg at 08/30/18 0818  . Influenza vac split quadrivalent PF (FLUARIX) injection 0.5 mL  0.5 mL Intramuscular Tomorrow-1000 Agyei, Obed K, MD      . OLANZapine (ZYPREXA) tablet 5 mg  5 mg Oral BID PRN Beola Cord, MD   5 mg at 08/31/18 0900  . pantoprazole (PROTONIX) EC tablet 40 mg  40 mg Oral Daily Beola Cord, MD   40 mg at 08/31/18 5621     Discharge Medications: Please  see discharge summary for a list of discharge medications.  Relevant Imaging Results:  Relevant Lab Results:   Additional Information SS#[id number] [place], [place]

## 2018-08-31 NOTE — Progress Notes (Signed)
I got a message that the patient's sister Lupita Leash wanted to speak to me.  I went up to Ms. Swiatek's room and got permission to call her sister.  I called Lupita Leash at [phone]  [phone].  I had her give me her sister's date of birth before proceeding.  I clarified my role and my position.  Lupita Leash stated she no longer wanted Dr. Dortha Schwalbe to be part of her sister's medical team but did not say why.  I informed her that tomorrow, October 1, the teams were changing and that neither Dr. Dortha Schwalbe nor I would be following Ms. Perales.  I told her Dr. Mikey Bussing would be taking over for me.  I then asked if she had any other questions.  She stated she did not but she would talk to her new doctor tomorrow.  Blanch Media, MD

## 2018-09-01 ENCOUNTER — Encounter (HOSPITAL_COMMUNITY): Payer: Self-pay | Admitting: Internal Medicine

## 2018-09-01 ENCOUNTER — Encounter (HOSPITAL_COMMUNITY)
Admission: EM | Disposition: A | Discharge status: Skilled Nursing Facility | Payer: Self-pay | Source: Home / Self Care | Attending: Internal Medicine

## 2018-09-01 ENCOUNTER — Inpatient Hospital Stay (HOSPITAL_COMMUNITY): Payer: Self-pay | Admitting: Anesthesiology

## 2018-09-01 HISTORY — PX: SYNDESMOSIS REPAIR: SHX5182

## 2018-09-01 LAB — BASIC METABOLIC PANEL
Anion gap: 12 (ref 5–15)
BUN: 8 mg/dL (ref 6–20)
CO2: 24 mmol/L (ref 22–32)
Calcium: 9.2 mg/dL (ref 8.9–10.3)
Chloride: 98 mmol/L (ref 98–111)
Creatinine, Ser: 0.86 mg/dL (ref 0.44–1.00)
GFR calc Af Amer: >60 mL/min (ref >60)
GFR calc non Af Amer: >60 mL/min (ref >60)
GLUCOSE: 73 mg/dL (ref 70–99)
POTASSIUM: 3.6 mmol/L (ref 3.5–5.1)
Sodium: 134 mmol/L — ABNORMAL LOW (ref 135–145)

## 2018-09-01 SURGERY — REPAIR, SYNDESMOSIS, ANKLE
Anesthesia: General | Site: Ankle | Laterality: Right

## 2018-09-01 MED ORDER — HYDROMORPHONE HCL 1 MG/ML IJ SOLN: 0.2500 mg | INTRAMUSCULAR | Status: DC | PRN

## 2018-09-01 MED ORDER — ONDANSETRON HCL 4 MG/2ML IJ SOLN
INTRAMUSCULAR | Status: AC
  Filled 2018-09-01: qty 2

## 2018-09-01 MED ORDER — PHENYLEPHRINE 40 MCG/ML (10ML) SYRINGE FOR IV PUSH (FOR BLOOD PRESSURE SUPPORT)
PREFILLED_SYRINGE | INTRAVENOUS | Status: DC | PRN
  Administered 2018-09-01 (×2): 80 ug via INTRAVENOUS

## 2018-09-01 MED ORDER — ENOXAPARIN SODIUM 40 MG/0.4ML ~~LOC~~ SOLN
40.0000 mg | SUBCUTANEOUS | Status: DC
  Administered 2018-09-02 – 2018-09-03 (×2): 40 mg via SUBCUTANEOUS
  Filled 2018-09-01 (×2): qty 0.4

## 2018-09-01 MED ORDER — LIDOCAINE 2% (20 MG/ML) 5 ML SYRINGE
INTRAMUSCULAR | Status: AC
  Filled 2018-09-01: qty 5

## 2018-09-01 MED ORDER — EPHEDRINE 5 MG/ML INJ
INTRAVENOUS | Status: AC
  Filled 2018-09-01: qty 10

## 2018-09-01 MED ORDER — MIDAZOLAM HCL 5 MG/5ML IJ SOLN
INTRAMUSCULAR | Status: DC | PRN
  Administered 2018-09-01: 2 mg via INTRAVENOUS

## 2018-09-01 MED ORDER — MIDAZOLAM HCL 2 MG/2ML IJ SOLN
INTRAMUSCULAR | Status: AC
  Filled 2018-09-01: qty 2

## 2018-09-01 MED ORDER — 0.9 % SODIUM CHLORIDE (POUR BTL) OPTIME
TOPICAL | Status: DC | PRN
  Administered 2018-09-01: 1000 mL

## 2018-09-01 MED ORDER — LIDOCAINE 2% (20 MG/ML) 5 ML SYRINGE
INTRAMUSCULAR | Status: DC | PRN
  Administered 2018-09-01: 60 mg via INTRAVENOUS

## 2018-09-01 MED ORDER — FENTANYL CITRATE (PF) 250 MCG/5ML IJ SOLN
INTRAMUSCULAR | Status: AC
  Filled 2018-09-01: qty 5

## 2018-09-01 MED ORDER — PHENYLEPHRINE 40 MCG/ML (10ML) SYRINGE FOR IV PUSH (FOR BLOOD PRESSURE SUPPORT)
PREFILLED_SYRINGE | INTRAVENOUS | Status: AC
  Filled 2018-09-01: qty 10

## 2018-09-01 MED ORDER — ONDANSETRON HCL 4 MG/2ML IJ SOLN
INTRAMUSCULAR | Status: DC | PRN
  Administered 2018-09-01: 4 mg via INTRAVENOUS

## 2018-09-01 MED ORDER — MEPERIDINE HCL 50 MG/ML IJ SOLN: 6.2500 mg | INTRAMUSCULAR | Status: DC | PRN

## 2018-09-01 MED ORDER — EPHEDRINE SULFATE-NACL 50-0.9 MG/10ML-% IV SOSY
PREFILLED_SYRINGE | INTRAVENOUS | Status: DC | PRN
  Administered 2018-09-01: 5 mg via INTRAVENOUS
  Administered 2018-09-01: 10 mg via INTRAVENOUS

## 2018-09-01 MED ORDER — PROPOFOL 10 MG/ML IV BOLUS
INTRAVENOUS | Status: DC | PRN
  Administered 2018-09-01: 150 mg via INTRAVENOUS

## 2018-09-01 MED ORDER — PROMETHAZINE HCL 25 MG/ML IJ SOLN: 6.2500 mg | INTRAMUSCULAR | Status: DC | PRN

## 2018-09-01 MED ORDER — HYDROMORPHONE HCL 1 MG/ML IJ SOLN
0.5000 mg | INTRAMUSCULAR | Status: DC | PRN
  Administered 2018-09-01: 0.5 mg via INTRAVENOUS
  Filled 2018-09-01: qty 1

## 2018-09-01 MED ORDER — ARTIFICIAL TEARS OPHTHALMIC OINT
TOPICAL_OINTMENT | OPHTHALMIC | Status: AC
  Filled 2018-09-01: qty 3.5

## 2018-09-01 MED ORDER — LACTATED RINGERS IV SOLN
INTRAVENOUS | Status: DC
  Administered 2018-09-01: 11:00:00 via INTRAVENOUS

## 2018-09-01 MED ORDER — BUPIVACAINE HCL (PF) 0.25 % IJ SOLN
INTRAMUSCULAR | Status: DC | PRN
  Administered 2018-09-01: 20 mL

## 2018-09-01 MED ORDER — OXYCODONE HCL 5 MG PO TABS
5.0000 mg | ORAL_TABLET | ORAL | Status: DC | PRN
  Administered 2018-09-01 – 2018-09-02 (×3): 5 mg via ORAL
  Filled 2018-09-01 (×3): qty 1

## 2018-09-01 MED ORDER — FENTANYL CITRATE (PF) 100 MCG/2ML IJ SOLN
INTRAMUSCULAR | Status: DC | PRN
  Administered 2018-09-01: 25 ug via INTRAVENOUS
  Administered 2018-09-01: 50 ug via INTRAVENOUS
  Administered 2018-09-01: 25 ug via INTRAVENOUS

## 2018-09-01 MED ORDER — OXYCODONE HCL 5 MG PO TABS: 5.0000 mg | ORAL_TABLET | Freq: Once | ORAL | Status: DC | PRN

## 2018-09-01 MED ORDER — BUPIVACAINE HCL (PF) 0.25 % IJ SOLN
INTRAMUSCULAR | Status: AC
  Filled 2018-09-01: qty 30

## 2018-09-01 MED ORDER — OXYCODONE HCL 5 MG/5ML PO SOLN: 5.0000 mg | Freq: Once | ORAL | Status: DC | PRN

## 2018-09-01 SURGICAL SUPPLY — 71 items
ALCOHOL 70% 16 OZ (MISCELLANEOUS) ×2 IMPLANT
BANDAGE ACE 6X5 VEL STRL LF (GAUZE/BANDAGES/DRESSINGS) ×2 IMPLANT
BANDAGE ESMARK 6X9 LF (GAUZE/BANDAGES/DRESSINGS) ×1 IMPLANT
BIT DRILL 2 CANN GRADUATED (BIT) ×1 IMPLANT
BIT DRILL 2.5 CANN STRL (BIT) ×1 IMPLANT
BIT DRILL 2.7 (BIT) ×2
BIT DRILL 2.7X2.7/3XSCR ANKL (BIT) IMPLANT
BIT DRL 2.7X2.7/3XSCR ANKL (BIT) ×1
BLADE SURG 15 STRL LF DISP TIS (BLADE) ×1 IMPLANT
BLADE SURG 15 STRL SS (BLADE) ×2
BNDG CMPR 9X6 STRL LF SNTH (GAUZE/BANDAGES/DRESSINGS) ×2
BNDG COHESIVE 4X5 TAN STRL (GAUZE/BANDAGES/DRESSINGS) ×2 IMPLANT
BNDG COHESIVE 6X5 TAN STRL LF (GAUZE/BANDAGES/DRESSINGS) ×2 IMPLANT
BNDG ESMARK 6X9 LF (GAUZE/BANDAGES/DRESSINGS) ×4
CANISTER SUCT 3000ML PPV (MISCELLANEOUS) ×2 IMPLANT
CHLORAPREP W/TINT 26ML (MISCELLANEOUS) ×4 IMPLANT
COVER SURGICAL LIGHT HANDLE (MISCELLANEOUS) ×2 IMPLANT
COVER WAND RF STERILE (DRAPES) ×2 IMPLANT
CUFF TOURNIQUET SINGLE 34IN LL (TOURNIQUET CUFF) ×2 IMPLANT
CUFF TOURNIQUET SINGLE 44IN (TOURNIQUET CUFF) IMPLANT
DRAPE OEC MINIVIEW 54X84 (DRAPES) ×2 IMPLANT
DRAPE U-SHAPE 47X51 STRL (DRAPES) ×2 IMPLANT
DRSG MEPITEL 4X7.2 (GAUZE/BANDAGES/DRESSINGS) ×1 IMPLANT
DRSG PAD ABDOMINAL 8X10 ST (GAUZE/BANDAGES/DRESSINGS) ×4 IMPLANT
ELECT REM PT RETURN 9FT ADLT (ELECTROSURGICAL) ×2
ELECTRODE REM PT RTRN 9FT ADLT (ELECTROSURGICAL) ×1 IMPLANT
GAUZE SPONGE 4X4 12PLY STRL (GAUZE/BANDAGES/DRESSINGS) ×1 IMPLANT
GAUZE XEROFORM 1X8 LF (GAUZE/BANDAGES/DRESSINGS) ×1 IMPLANT
GLOVE BIO SURGEON STRL SZ7.5 (GLOVE) ×1 IMPLANT
GLOVE BIO SURGEON STRL SZ8 (GLOVE) ×2 IMPLANT
GLOVE BIOGEL PI IND STRL 8 (GLOVE) ×1 IMPLANT
GLOVE BIOGEL PI INDICATOR 8 (GLOVE) ×1
GLOVE ECLIPSE 8.0 STRL XLNG CF (GLOVE) ×2 IMPLANT
GOWN STRL REUS W/ TWL LRG LVL3 (GOWN DISPOSABLE) ×1 IMPLANT
GOWN STRL REUS W/ TWL XL LVL3 (GOWN DISPOSABLE) ×2 IMPLANT
GOWN STRL REUS W/TWL LRG LVL3 (GOWN DISPOSABLE) ×2
GOWN STRL REUS W/TWL XL LVL3 (GOWN DISPOSABLE) ×4
KIT BASIN OR (CUSTOM PROCEDURE TRAY) ×2 IMPLANT
KIT TURNOVER KIT B (KITS) ×2 IMPLANT
NDL HYPO 25GX1X1/2 BEV (NEEDLE) IMPLANT
NEEDLE HYPO 25GX1X1/2 BEV (NEEDLE) ×2 IMPLANT
NS IRRIG 1000ML POUR BTL (IV SOLUTION) ×2 IMPLANT
PACK ORTHO EXTREMITY (CUSTOM PROCEDURE TRAY) ×2 IMPLANT
PAD ARMBOARD 7.5X6 YLW CONV (MISCELLANEOUS) ×4 IMPLANT
PAD CAST 4YDX4 CTTN HI CHSV (CAST SUPPLIES) ×1 IMPLANT
PADDING CAST COTTON 4X4 STRL (CAST SUPPLIES) ×4
PLATE THIRD TUBULAR 7 HOLE (Plate) ×1 IMPLANT
SCREW BN T10 FT 20X2.7XST CORT (Screw) IMPLANT
SCREW CORT 2.7X20 (Screw) ×2 IMPLANT
SCREW CORT T15 FT 50X3.5XST (Screw) IMPLANT
SCREW CORTICAL 2.7X18 LO PRO (Screw) ×1 IMPLANT
SCREW CORTICAL 3.5 (Screw) ×2 IMPLANT
SCREW LOCK T15 FT 18X3.5XST (Screw) IMPLANT
SCREW LOCKING 3.5X12MM (Screw) ×1 IMPLANT
SCREW LOCKING 3.5X18 (Screw) ×2 IMPLANT
SCREW LOW PROFILE 3.5X14 (Screw) ×2 IMPLANT
SCREW NON-LOCKING 3.5X12MM (Screw) ×1 IMPLANT
SPLINT FIBERGLASS 4X30 (CAST SUPPLIES) ×2 IMPLANT
SPONGE LAP 18X18 X RAY DECT (DISPOSABLE) ×2 IMPLANT
STAPLER VISISTAT 35W (STAPLE) ×1 IMPLANT
SUCTION FRAZIER HANDLE 10FR (MISCELLANEOUS) ×1
SUCTION TUBE FRAZIER 10FR DISP (MISCELLANEOUS) ×1 IMPLANT
SUT ETHILON 3 0 PS 1 (SUTURE) ×1 IMPLANT
SUT MNCRL AB 3-0 PS2 18 (SUTURE) ×1 IMPLANT
SUT PDS AB 2-0 CT1 27 (SUTURE) ×1 IMPLANT
SUT VIC AB 2-0 CT1 27 (SUTURE)
SUT VIC AB 2-0 CT1 TAPERPNT 27 (SUTURE) ×2 IMPLANT
SYR CONTROL 10ML LL (SYRINGE) ×1 IMPLANT
TOWEL OR 17X24 6PK STRL BLUE (TOWEL DISPOSABLE) ×2 IMPLANT
TOWEL OR 17X26 10 PK STRL BLUE (TOWEL DISPOSABLE) ×2 IMPLANT
TUBE CONNECTING 12X1/4 (SUCTIONS) ×2 IMPLANT

## 2018-09-01 NOTE — Anesthesia Preprocedure Evaluation (Signed)
Anesthesia Evaluation    Airway Mallampati: II  TM Distance: >3 FB Neck ROM: Full    Dental no notable dental hx.    Pulmonary Current Smoker,    Pulmonary exam normal breath sounds clear to auscultation       Cardiovascular hypertension, Normal cardiovascular exam Rhythm:Regular Rate:Normal     Neuro/Psych    GI/Hepatic   Endo/Other    Renal/GU      Musculoskeletal   Abdominal   Peds  Hematology   Anesthesia Other Findings   Reproductive/Obstetrics                             Anesthesia Physical Anesthesia Plan  ASA: II  Anesthesia Plan: General   Post-op Pain Management:    Induction: Intravenous  PONV Risk Score and Plan: 2 and Ondansetron and Midazolam  Airway Management Planned: Oral ETT and LMA  Additional Equipment:   Intra-op Plan:   Post-operative Plan: Extubation in OR  Informed Consent: I have reviewed the patients History and Physical, chart, labs and discussed the procedure including the risks, benefits and alternatives for the proposed anesthesia with the patient or authorized representative who has indicated his/her understanding and acceptance.   Dental advisory given  Plan Discussed with: CRNA  Anesthesia Plan Comments:         Anesthesia Quick Evaluation

## 2018-09-01 NOTE — Progress Notes (Signed)
   Subjective: She states that she has been feeling much better over night stating "I feel much clearer and back to normal." She denies any new acute complaints. She is aware of her open reduction and internal fixation planned for today and does not have any questions.   Objective:  Vital signs in last 24 hours: Vitals:   08/31/18 0450 08/31/18 0624 08/31/18 2241 09/01/18 0814  BP:  100/74 (!) 115/57 101/71  Pulse:  76 74 69  Resp:  16 18   Temp:  98.2 F (36.8 C) 98.4 F (36.9 C)   TempSrc:  Oral Oral   SpO2:  100% 100% 100%  Weight: 54.9 kg     Height:       Physical Exam  Constitutional: She appears well-developed and well-nourished.  HENT:  Head: Normocephalic and atraumatic.  Eyes: EOM are normal.  Neck: Normal range of motion.  Cardiovascular: Normal rate, regular rhythm and normal heart sounds.  Pulmonary/Chest: Effort normal and breath sounds normal.  Musculoskeletal:  No LE edema  Neurological: She is alert.  Skin: Skin is warm and dry.  Psychiatric: She has a normal mood and affect. Her behavior is normal.  Her mood is very positive today.   Nursing note and vitals reviewed.   Assessment/Plan:  Principal Problem:   Closed fracture of right distal fibula Active Problems:   Hyponatremia   Hypokalemia   Non-intractable vomiting with nausea   Lower abdominal pain   Right leg pain   Head injury   Cervical strain   Pressure injury of skin   Adjustment disorder with mixed disturbance of emotions and conduct  Ms. Jimerson is a 57 year old female who was found to have a right distal fibula fracture following a ground-level fall and has had episodes of confusion and disorientation while in hospital.  Right Distal Fibula Fracture: Ms. Shan's fracture from a ground level fall suggests underlying osteoporosis with subsequent pathologic fracture. Her calcium has normalized and we are awaiting her vitamin D levels. She will greatly benefit from bisphosphonate  therapy but will hold until 2-4 weeks after discharge. Ms. Dionisio pain has remained stable throughout the night and she has not required her PRN oxycodone 10 mg. She will be undergoing open reduction and internal fixation of right lateral malleolus with possible syndesmosis deltoid ligament reconstruction today. Physical therapy has been consulted and recommendations include SNF placement. Ms. Reitan is in full agreement. - Appreciate ortho recommendation - Continue Oxycodone IR 5 mg q6h PRN severe pain, Tylenol 650 mg qh6  PRN moderate pain, Ibuprofen 400 mg q6h PRN moderate pain - Vitamin D pending  Adjustment disorder with mixed disturbance of emotions and conduct: She has not had any reports of agitation and confusion overnight. She is currently prescribed Zyprexa 5 mg BID prn agitation and has not required this medication since 0900 yesterday. MOCA performed 5 days ago was concerning for underlying dementia (18/30) although this is less likely in a younger individual. I will however plan on repeating the [facility] prior to discharge.  - Continue Zyprexa 5 mg BID prn agitation  Dispo: Anticipated discharge when clinical course improves. She will be undergoing orthopedic surgery today and will likely need several days to recover before being placed at a SNF.  Synetta Shadow, MD 09/01/2018, 10:11 AM Pager: [phone]

## 2018-09-01 NOTE — Transfer of Care (Signed)
Immediate Anesthesia Transfer of Care Note  Patient: Sonya Cohen  Procedure(s) Performed: OPEN TREATMENT OF RIGHT LATERAL MALLEOLUS (Right Ankle)  Patient Location: PACU  Anesthesia Type:General  Level of Consciousness: awake, alert  and oriented  Airway & Oxygen Therapy: Patient Spontanous Breathing and Patient connected to face mask oxygen  Post-op Assessment: Report given to RN and Post -op Vital signs reviewed and stable  Post vital signs: Reviewed and stable  Last Vitals:  Vitals Value Taken Time  BP 138/79 09/01/2018  1:22 PM  Temp    Pulse 110 09/01/2018  1:26 PM  Resp 20 09/01/2018  1:26 PM  SpO2 100 % 09/01/2018  1:26 PM  Vitals shown include unvalidated device data.  Last Pain:  Vitals:   09/01/18 0715  TempSrc:   PainSc: 0-No pain      Patients Stated Pain Goal: 0 (08/31/18 0900)  Complications: No apparent anesthesia complications

## 2018-09-01 NOTE — Anesthesia Procedure Notes (Signed)
Procedure Name: LMA Insertion Date/Time: 09/01/2018 11:56 AM Performed by: Keitha Butte, CRNA Pre-anesthesia Checklist: Patient identified, Emergency Drugs available, Suction available, Patient being monitored and Timeout performed Patient Re-evaluated:Patient Re-evaluated prior to induction Oxygen Delivery Method: Circle system utilized Preoxygenation: Pre-oxygenation with 100% oxygen Induction Type: IV induction Ventilation: Mask ventilation without difficulty LMA: LMA inserted LMA Size: 4.0 Number of attempts: 1 Placement Confirmation: positive ETCO2 and breath sounds checked- equal and bilateral Tube secured with: Tape

## 2018-09-01 NOTE — Brief Op Note (Signed)
09/01/2018  1:20 PM  PATIENT:  Sonya Cohen  57 y.o. female  PRE-OPERATIVE DIAGNOSIS:  RIGHT Lateral malleolar ANKLE FRACTURE  POST-OPERATIVE DIAGNOSIS:   RIGHT ANKLE FRACTURE Right Ankle Syndesmosis disruption  PROCEDURE:  Procedure(s): OPEN TREATMENT OF RIGHT LATERAL MALLEOLUS (Right)  ORIF Syndesmosis  SURGEON:  Surgeon(s) and Role:    Terance Hart, MD - Primary  PHYSICIAN ASSISTANT: none  ASSISTANTS: none   ANESTHESIA:   general  EBL:  10 mL   BLOOD ADMINISTERED:none  DRAINS: none   LOCAL MEDICATIONS USED:  MARCAINE     SPECIMEN:  No Specimen  DISPOSITION OF SPECIMEN:  N/A  COUNTS:  YES  TOURNIQUET:   Total Tourniquet Time Documented: Thigh (Right) - 51 minutes Total: Thigh (Right) - 51 minutes   DICTATION: .Reubin Milan Dictation  PLAN OF CARE: Admit to inpatient   PATIENT DISPOSITION:  PACU - hemodynamically stable.   Delay start of Pharmacological VTE agent (>24hrs) due to surgical blood loss or risk of bleeding: yes

## 2018-09-01 NOTE — H&P (Signed)
H&P Update.  I reviewed the H&P from current hospital admission dated 08/24/18 and there are no changes that would delay surgery. She has had improvement in her mental status.    Proceed with planned surgery to right ankle

## 2018-09-01 NOTE — Progress Notes (Signed)
Subjective: Genita is doing well today.  Pain controlled.  Denies SOB or fevers.   Ready for surgery.   Objective: Vital signs in last 24 hours: Temp:  [98.4 F (36.9 C)] 98.4 F (36.9 C) (09/30 2241) Pulse Rate:  [69-74] 69 (10/01 0814) Resp:  [18] 18 (09/30 2241) BP: (101-115)/(57-71) 101/71 (10/01 0814) SpO2:  [100 %] 100 % (10/01 0814)   Recent Labs    08/31/18 0432 09/01/18 0313  NA 137 134*  K 4.4 3.6  CL 101 98  CO2 28 24  BUN 6 8  CREATININE 0.76  0.80 0.86  GLUCOSE 79 73  CALCIUM 9.4 9.2   Gen: awake and alert. Oriented RLE in splint.  Wiggles toes.  SILT over toes.  Foot WWP. No TTP to knee.    Assessment/Plan: Right ankle fracture.  Will proceed with surgery today.  She will be NWB after surgery in splint.   DVT prophyaxis: Lovenox    Terance Hart 09/01/2018, 11:02 AM

## 2018-09-02 ENCOUNTER — Encounter (HOSPITAL_COMMUNITY): Payer: Self-pay | Admitting: Orthopaedic Surgery

## 2018-09-02 DIAGNOSIS — R197 Diarrhea, unspecified: Secondary | ICD-10-CM

## 2018-09-02 DIAGNOSIS — R112 Nausea with vomiting, unspecified: Secondary | ICD-10-CM

## 2018-09-02 DIAGNOSIS — R410 Disorientation, unspecified: Secondary | ICD-10-CM

## 2018-09-02 LAB — CBC WITH DIFFERENTIAL/PLATELET
Abs Immature Granulocytes: 0 10*3/uL (ref 0.0–0.1)
Basophils Absolute: 0.1 10*3/uL (ref 0.0–0.1)
Basophils Relative: 1 %
EOS PCT: 1 %
Eosinophils Absolute: 0.1 10*3/uL (ref 0.0–0.7)
HCT: 39.2 % (ref 36.0–46.0)
HEMOGLOBIN: 13.5 g/dL (ref 12.0–15.0)
Immature Granulocytes: 0 %
LYMPHS ABS: 1.8 10*3/uL (ref 0.7–4.0)
LYMPHS PCT: 25 %
MCH: 35.2 pg — AB (ref 26.0–34.0)
MCHC: 34.4 g/dL (ref 30.0–36.0)
MCV: 102.3 fL — ABNORMAL HIGH (ref 78.0–100.0)
MONO ABS: 0.8 10*3/uL (ref 0.1–1.0)
Monocytes Relative: 11 %
Neutro Abs: 4.3 10*3/uL (ref 1.7–7.7)
Neutrophils Relative %: 62 %
Platelets: 262 10*3/uL (ref 150–400)
RBC: 3.83 MIL/uL — ABNORMAL LOW (ref 3.87–5.11)
RDW: 12 % (ref 11.5–15.5)
WBC: 7.1 10*3/uL (ref 4.0–10.5)

## 2018-09-02 LAB — VITAMIN D 25 HYDROXY (VIT D DEFICIENCY, FRACTURES): Vit D, 25-Hydroxy: 24.1 ng/mL — ABNORMAL LOW (ref 30.0–100.0)

## 2018-09-02 LAB — BASIC METABOLIC PANEL
Anion gap: 11 (ref 5–15)
BUN: 11 mg/dL (ref 6–20)
CHLORIDE: 94 mmol/L — AB (ref 98–111)
CO2: 25 mmol/L (ref 22–32)
Calcium: 8.6 mg/dL — ABNORMAL LOW (ref 8.9–10.3)
Creatinine, Ser: 0.98 mg/dL (ref 0.44–1.00)
GFR calc Af Amer: >60 mL/min (ref >60)
GFR calc non Af Amer: >60 mL/min (ref >60)
GLUCOSE: 81 mg/dL (ref 70–99)
POTASSIUM: 3.7 mmol/L (ref 3.5–5.1)
Sodium: 130 mmol/L — ABNORMAL LOW (ref 135–145)

## 2018-09-02 MED ORDER — PROMETHAZINE HCL 25 MG/ML IJ SOLN: 25.0000 mg | Freq: Four times a day (QID) | INTRAMUSCULAR | Status: DC | PRN

## 2018-09-02 MED ORDER — OXYCODONE HCL 5 MG PO TABS: 5.0000 mg | ORAL_TABLET | ORAL | Status: DC

## 2018-09-02 MED ORDER — ADULT MULTIVITAMIN W/MINERALS CH
1.0000 | ORAL_TABLET | Freq: Every day | ORAL | Status: DC
  Administered 2018-09-02 – 2018-09-04 (×3): 1 via ORAL
  Filled 2018-09-02 (×3): qty 1

## 2018-09-02 MED ORDER — VITAMIN D 1000 UNITS PO TABS
1000.0000 [IU] | ORAL_TABLET | Freq: Every day | ORAL | Status: DC
  Administered 2018-09-02 – 2018-09-04 (×3): 1000 [IU] via ORAL
  Filled 2018-09-02 (×3): qty 1

## 2018-09-02 MED ORDER — OXYCODONE HCL 5 MG PO TABS
5.0000 mg | ORAL_TABLET | ORAL | Status: DC
  Administered 2018-09-02 – 2018-09-04 (×12): 5 mg via ORAL
  Filled 2018-09-02 (×13): qty 1

## 2018-09-02 MED ORDER — POLYETHYLENE GLYCOL 3350 17 G PO PACK
17.0000 g | PACK | Freq: Every day | ORAL | Status: DC
  Administered 2018-09-03 – 2018-09-04 (×2): 17 g via ORAL
  Filled 2018-09-02 (×2): qty 1

## 2018-09-02 MED ORDER — PROMETHAZINE HCL 25 MG/ML IJ SOLN
12.5000 mg | Freq: Four times a day (QID) | INTRAMUSCULAR | Status: DC | PRN
  Administered 2018-09-02 – 2018-09-03 (×3): 12.5 mg via INTRAVENOUS
  Filled 2018-09-02 (×3): qty 1

## 2018-09-02 MED ORDER — CALCIUM CARBONATE ANTACID 500 MG PO CHEW
1.0000 | CHEWABLE_TABLET | Freq: Three times a day (TID) | ORAL | Status: DC
  Administered 2018-09-02 – 2018-09-04 (×7): 200 mg via ORAL
  Filled 2018-09-02 (×7): qty 1

## 2018-09-02 MED ORDER — PRO-STAT SUGAR FREE PO LIQD
30.0000 mL | Freq: Two times a day (BID) | ORAL | Status: DC
  Administered 2018-09-02 – 2018-09-04 (×4): 30 mL via ORAL
  Filled 2018-09-02 (×5): qty 30

## 2018-09-02 MED ORDER — BOOST / RESOURCE BREEZE PO LIQD CUSTOM
1.0000 | Freq: Three times a day (TID) | ORAL | Status: DC
  Administered 2018-09-02 – 2018-09-04 (×6): 1 via ORAL

## 2018-09-02 MED ORDER — HYDROMORPHONE HCL 1 MG/ML IJ SOLN
0.5000 mg | Freq: Four times a day (QID) | INTRAMUSCULAR | Status: DC | PRN
  Administered 2018-09-02 – 2018-09-03 (×3): 0.5 mg via INTRAVENOUS
  Filled 2018-09-02 (×3): qty 1

## 2018-09-02 NOTE — Anesthesia Postprocedure Evaluation (Signed)
Anesthesia Post Note  Patient: Sonya Cohen  Procedure(s) Performed: OPEN TREATMENT OF RIGHT LATERAL MALLEOLUS (Right Ankle)     Patient location during evaluation: PACU Anesthesia Type: General Level of consciousness: awake and alert Pain management: pain level controlled Vital Signs Assessment: post-procedure vital signs reviewed and stable Respiratory status: spontaneous breathing, nonlabored ventilation and respiratory function stable Cardiovascular status: blood pressure returned to baseline and stable Postop Assessment: no apparent nausea or vomiting Anesthetic complications: no    Last Vitals:  Vitals:   09/02/18 1709 09/02/18 1711  BP: (!) 86/46 115/81  Pulse: (!) 107 (!) 121  Resp: 17   Temp: 36.7 C   SpO2: 99%     Last Pain:  Vitals:   09/02/18 1709  TempSrc: Oral  PainSc:    Pain Goal: Patients Stated Pain Goal: 0 (09/02/18 0420)               [name]

## 2018-09-02 NOTE — Progress Notes (Addendum)
   Subjective: Her pain was not well-controlled over night with Oxycodone 5 mg IR BID. She was subsequently started on Dilaudid 0.5 mg q6h PRN pain. She received 2 doses of her dialudid last night (last @2300  last night morning) and 2 doses of IR Oxy @2300  and 0321. Her pain this morning is not well controlled on her pain medications. She was very teary this morning stating "I'm not usually like this but my leg hurts." She states that the oxycodone and dilaudid both help decrease her pain but there are times where they wear off. She denies any other acute complaints including shortness of breath, chest pain, abdominal pain. Her last bowel movement was yesterday morning.  Objective:  Vital signs in last 24 hours: Vitals:   09/01/18 1337 09/01/18 1351 09/01/18 2325 09/02/18 0420  BP: (!) 147/83 (!) 144/79 (!) 112/99   Pulse: (!) 108 96 69   Resp: 10 13 17    Temp:  (!) 97.5 F (36.4 C) 98 F (36.7 C)   TempSrc:   Oral   SpO2: 99% 100% 100%   Weight:    58.3 kg  Height:       Physical Exam  Constitutional: She appears well-developed and well-nourished.  HENT:  Head: Normocephalic and atraumatic.  Eyes: EOM are normal.  Neck: Normal range of motion.  Cardiovascular: Regular rhythm and normal heart sounds.  Pulmonary/Chest: Effort normal and breath sounds normal.  Musculoskeletal: Normal range of motion.  Right leg is wrapped with bandages. No left leg edema.  Neurological: She is alert.  Skin: Skin is warm and dry.  Psychiatric: She has a normal mood and affect. Her behavior is normal.  Vitals reviewed.   Assessment/Plan:  Principal Problem:   Closed fracture of right distal fibula Active Problems:   Pressure injury of skin   Adjustment disorder with mixed disturbance of emotions and conduct  Ms. Mathurin is a 57 year old female who was found to have a right distal fibula fracture following a ground-level fall now POD1 from open treatment of right lateral malleolus and ORIF  syndesmosis.   Right Distal Fibula Fracture: Ms. Riester's fracture from a ground level fall suggests underlying osteoporosis with subsequent pathologic fracture. She has low Vitamin D levels (24.1) and low calcium (8.6). Ms. Amoroso is now POD 1 orthopedic surgery for her right fibular fracture. Her post-op pain is uncontrolled today with PRN Oxy IR 5 mg and Dialudid 0.5 mg IR q6h. She has not been getting either medication regularly. - Scheduled Oxycodone 5 mg IR q4h - Continue Dilaudid 0.5 mg q6h PRN breakthrough pain - Continue both Tylenol 650 mg qh6  PRN moderate pain, Ibuprofen 400 mg q6h PRN moderate pain. - Start Vitamin D 1000 units QD and calcium carbonate 500 mg TID  Adjustment disorder with mixed disturbance of emotions and conduct: She has not had any reports of agitation and confusion overnight. She is currently prescribed Zyprexa 5 mg BID prn agitation which she has not been given. Ms. Childers did seem to have more anxiety today with tangential thinking.  - Asked psychiatry to evaluate Ms. Wetmore's mental capacity and potential drug treatment post-discharge. - Continue Zyprexa 5 mg BID prn agitation. - MOCA performed 5 days ago was concerning for underlying dementia (18/30). Plan on repeating MOCA prior to discharge.   Dispo: Anticipated discharge in approximately 1-2 days to SNF.  Synetta Shadow, MD 09/02/2018, 6:13 AM Pager: [phone]

## 2018-09-02 NOTE — Progress Notes (Signed)
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  She states she retired from her job 2 or 3 months ago and has been traveling and moving between [place], [place], and [place].  She states she just returned to [place] 1 to 2 weeks ago.  1 week ago she had sudden onset of nausea and vomiting.  This is associated with left lower quadrant pain and one episode of diarrhea.  Subsequently, she has been drinking lots of water and has experienced diaphoresis, lightheadedness, palpitations, lower extremity weakness, fatigue, and dyspnea.  She felt prior to admission at home because her legs felt weak and painful.  This resulted in right ankle pain.  In the ED, she was found to be hypertensive, hyponatremic, with a lactic acidosis, and a right distal fibula fracture, s/p ORIF 09/01/18. R LE is NWB   OT comments   Pt was seen for brief OT treatment session with focus on ADL's in sitting as pt states "I can't do anything with you today" rating pain as 10/10 in R ankle. Pt was anxious/talkative throughout session that focused on grooming, bathing & pt educatiion on functional transfers and demo of NWB RLE. Pt was noted to be confused at times stating "The electrical cord and outlet in the wall right there has been hurting me all night"; pt also states "I'm pregnant and stinky, but you'll think I'm crazy b/c I'm not pregnant but I'm going to say I am" and "I just need to not be stinky." Pt making slow progress toward goals.   Follow Up Recommendations  SNF;Supervision/Assistance - 24 hour    Equipment Recommendations  Other (comment)(Defer to next venue)    Recommendations for Other Services      Precautions / Restrictions Precautions Precautions:  Fall Precaution Comments: R fibula fracture in ankle splint Restrictions Weight Bearing Restrictions: Yes RLE Weight Bearing: Non weight bearing       Mobility Bed Mobility Overal bed mobility: Needs Assistance Bed Mobility: Sit to Supine     Supine to sit: Supervision     General bed mobility comments: Reposistion in bed  Transfers Overall transfer level: (Pt refused any transfers)               General transfer comment: Pt educated verbally and demo re: R LE NWB for future transfers/functional mobility as able    Balance Overall balance assessment: Needs assistance Sitting-balance support: Feet supported;Single extremity supported Sitting balance-Leahy Scale: Fair                                     ADL either performed or assessed with clinical judgement   ADL Overall ADL's : Needs assistance/impaired     Grooming: Wash/dry hands;Wash/dry face;Oral care;Applying deodorant;Set up;Sitting Grooming Details (indicate cue type and reason): HOB elevated to sitting as pt declined any OOB transfers despite verbal encouragement. Upper Body Bathing: Set up;Sitting Upper Body Bathing Details (indicate cue type and reason): Pt bathed UB after set-up, talkative throughout, appears confused at times               Toilet Transfer Details (indicate cue type and reason): Pt declines transfer to toilet/3:1 this morning.  General ADL Comments: Pt was seen for brief OT treatment session with focus on ADL's in sitting as pt states "I can't do anything with you today" rating pain as 10/10 in R ankle. Pt was anxious/talkative throughout session that focused on grooming, bathing & pt educatiion on functional transfers and demo of NWB RLE. Pt was noted to be confused at times stating "The electrical cord and outlet in the wall right there has been hurting me all night"; pt also states "I'm pregnant and stinky, but you'll think I'm crazy b/c I'm not  pregnant but I'm going to say I am" and "I just need to not be stinky." Pt making slow progress toward goals.      Vision Baseline Vision/History: Wears glasses Wears Glasses: Reading only Patient Visual Report: No change from baseline     Perception     Praxis      Cognition Arousal/Alertness: Awake/alert Behavior During Therapy: Anxious;Impulsive Overall Cognitive Status: Difficult to assess                                 General Comments: Pt reports "The electrical cord and outlet in the wall right there has been hurting me all night"; pt also states "I'm pregnant and stinky, but you'll think I'm crazy b/c I'm not pregnant but I'm going to say I am"        Exercises     Shoulder Instructions       General Comments      Pertinent Vitals/ Pain       Pain Assessment: 0-10 Pain Score: 10-Worst pain ever Pain Location: R ankle(Reported pain level does not match observations (No facial grimmace, no crying out, talkative, no pursed lip breathing etc). Pt participated in breathing ex's for relaxation) Pain Descriptors / Indicators: Grimacing;Guarding Pain Intervention(s): Repositioned;Monitored during session;Limited activity within patient's tolerance;Patient requesting pain meds-RN notified  Home Living                                          Prior Functioning/Environment              Frequency  Min 2X/week        Progress Toward Goals  OT Goals(current goals can now be found in the care plan section)  Progress towards OT goals: Progressing toward goals  Acute Rehab OT Goals Patient Stated Goal: Get out of here and get better  Plan Discharge plan remains appropriate    Co-evaluation                 AM-PAC PT "6 Clicks" Daily Activity     Outcome Measure   Help from another person eating meals?: None Help from another person taking care of personal grooming?: A Little Help from another person toileting, which  includes using toliet, bedpan, or urinal?: A Lot Help from another person bathing (including washing, rinsing, drying)?: A Little Help from another person to put on and taking off regular upper body clothing?: A Little Help from another person to put on and taking off regular lower body clothing?: A Lot 6 Click Score: 17    End of Session    OT Visit Diagnosis: Unsteadiness on feet (R26.81);Muscle weakness (generalized) (M62.81);History of falling (Z91.81);Other symptoms and signs involving cognitive function   Activity Tolerance Patient tolerated treatment well;Patient limited by  pain   Patient Left in bed;with call bell/phone within reach;with nursing/sitter in room   Nurse Communication          Time: [phone] OT Time Calculation (min): 18 min  Charges: OT General Charges $OT Visit: 1 Visit OT Treatments $Self Care/Home Management : 8-22 mins    Alm Bustard, OTR/L 09/02/2018, 8:39 AM

## 2018-09-02 NOTE — Progress Notes (Signed)
Called to patient's bedside. Sonya Cohen reports several episodes of vomiting and diarrhea which began after drinking her Ensure shake. On exam, she is sitting up in bed dry-heaving and spitting up sputum. She is tachycardic into the 130's. Telemetry reviewed and shows sinus tachycardia with normal corrected QTc of 326. I prescribed Phenergan 25 mg q6h PRN nausea and vomiting. I am trying to avoid Phenergan due to a previous EKG several days ago showing prolonged QT. I anticipate that she may need Zyprexa PRN for agitation tonight and do not want to prescribe another drug such as Zofran which may also prolong her QT.

## 2018-09-02 NOTE — Social Work (Signed)
SNF referral made, pt uninsured will require LOG in order to discharge. Pt PASRR also pending.   Doy Hutching, LCSWA [facility] Clinical Social Work [phone]

## 2018-09-02 NOTE — Progress Notes (Signed)
PT Cancellation Note  Patient Details Name: Sonya Cohen MRN: [id number] DOB: 09/18/1961   Cancelled Treatment:    Reason Eval/Treat Not Completed: Other (comment). Per RN, pt currently nauseous and vomiting. Will follow-up for PT treatment.  Ina Homes, PT, DPT Acute Rehabilitation Services  Pager [phone] Office [phone]  Malachy Chamber 09/02/2018, 3:58 PM

## 2018-09-02 NOTE — Progress Notes (Signed)
Nutrition Follow-up  DOCUMENTATION CODES:   Not applicable  INTERVENTION:   -D/c Ensure Enlive po BID, each supplement provides 350 kcal and 20 grams of protein -MVI with minerals daily -30 ml Prostat BID, each supplement provides 100 kcals and 15 grams protein -Boost Breeze po TID, each supplement provides 250 kcal and 9 grams of protein  NUTRITION DIAGNOSIS:   Increased nutrient needs related to post-op healing as evidenced by estimated needs.  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  9/26- IVC 10/1- s/p ORIF rt lateral malleolus 10/2- psychiatry signed off  Reviewed I/O's: +609 ml x 24 hours and +9.4 L since admission  Spoke with pt and safety sitter at bedside. Pt complains of poor appetite today related to nausea. Sitter at bedside confirms that pt has not been eating well (both today and her last shift on Sunday, 08/30/18). Noted meal completion 0-60%. Sitter also reports that pt complains of food odors which tend to aggravate nausea. She is not taking her Ensure supplement "it makes me so, so sick". Noted some gingerale at bedside.   Plan to discharge to SNF once medically stable  Labs reviewed: Na: 130.   Diet Order:   Diet Order            Diet regular Room service appropriate? Yes; Fluid consistency: Thin  Diet effective now              EDUCATION NEEDS:   No education needs have been identified at this  time  Skin:  Skin Assessment: Skin Integrity Issues: Skin Integrity Issues:: Incisions Stage I: n/a Incisions: rt ankle  Last BM:  09/02/18  Height:   Ht Readings from Last 1 Encounters:  08/24/18 5\' 3"  (1.6 m)    Weight:   Wt Readings from Last 1 Encounters:  09/02/18 58.3 kg    Ideal Body Weight:  52.3 kg  BMI:  Body mass index is 22.78 kg/m.  Estimated Nutritional Needs:   Kcal:  1750-1950  Protein:  85-100 grams  Fluid:  >1.7 L    Kilah Drahos A. Mayford Knife, RD, LDN, CDE Pager: [phone] After hours Pager: [phone]

## 2018-09-02 NOTE — Consult Note (Addendum)
[facility] Psych Consult Progress Note  09/02/2018 11:53 AM Nasira Janusz  MRN:  [id number] Subjective:   Ms. Nelligan was last seen by the psychiatry consult service on 9/28 for agitation and confusion. Her symptoms were improving when seen and Zyprexa 5 mg BID PRN was recommended. She has not received Zyprexa over the past 24 hours. She reported poor sleep prior to admission. She reported feeling better and clearer on 10/1 but has been noted to be confused today following surgery for ankle fracture. Per family report, she retired from her 31 year job and cashed out her 74 (k). She has been living in a hotel. She has had 4 episodes of confusion since August and each time law enforcement was contacted in the setting of erratic behavior. She is recommended for SNF placement.   On interview, Ms. Elrod is alert and oriented to person, place and time. She does not recall periods of confusion. She reports significant nausea due to receiving medication. She also reports drinking too much gingerale. She reports that her ankle feels better today. She denies SI, HI or AVH. She reports poor sleep overnight but does report that she has been sleeping well on most nights. She denies problems with appetite. She is aware that she will be discharging to a SNF. She denies any concerns at this time.   Principal Problem: Delirium Diagnosis:   Patient Active Problem List   Diagnosis Date Noted  . Adjustment disorder with mixed disturbance of emotions and conduct [F43.25] 08/29/2018  . Pressure injury of skin [L89.90] 08/27/2018  . Closed fracture of right distal fibula [S82.831A]    Total Time spent with patient: 15 minutes  Past Psychiatric History: Depression and GAD  Past Medical History:  Past Medical History:  Diagnosis Date  . Cervical strain   . Head injury   . Hypertension   . Hypokalemia   . Hyponatremia 08/24/2018  . Lower abdominal pain   . Non-intractable vomiting with nausea   . Right  leg pain    History reviewed. No pertinent surgical history. Family History: History reviewed. No pertinent family history. Family Psychiatric  History: None  Social History:  Social History   Substance and Sexual Activity  Alcohol Use Yes  . Frequency: Never   Comment: Daily beer     Social History   Substance and Sexual Activity  Drug Use Never    Social History   Socioeconomic History  . Marital status: Legally Separated    Spouse name: Not on file  . Number of children: Not on file  . Years of education: Not on file  . Highest education level: Not on file  Occupational History  . Not on file  Social Needs  . Financial resource strain: Not on file  . Food insecurity:    Worry: Not on file    Inability: Not on file  . Transportation needs:    Medical: Not on file    Non-medical: Not on file  Tobacco Use  . Smoking status: Current Every Day Smoker  . Smokeless tobacco: Never Used  Substance and Sexual Activity  . Alcohol use: Yes    Frequency: Never    Comment: Daily beer  . Drug use: Never  . Sexual activity: Not on file  Lifestyle  . Physical activity:    Days per week: Not on file    Minutes per session: Not on file  . Stress: Not on file  Relationships  . Social connections:    Talks on  phone: Not on file    Gets together: Not on file    Attends religious service: Not on file    Active member of club or organization: Not on file    Attends meetings of clubs or organizations: Not on file    Relationship status: Not on file  Other Topics Concern  . Not on file  Social History Narrative  . Not on file    Sleep: Fair  Appetite:  Good  Current Medications: Current Facility-Administered Medications  Medication Dose Route Frequency Provider Last Rate Last Dose  . acetaminophen (TYLENOL) tablet 650 mg  650 mg Oral Q6H PRN Erle Crocker, MD   650 mg at 09/02/18 0046  . calcium carbonate (TUMS - dosed in mg elemental calcium) chewable tablet  200 mg of elemental calcium  1 tablet Oral TID Carroll Sage, MD   200 mg of elemental calcium at 09/02/18 0934  . cholecalciferol (VITAMIN D) tablet 1,000 Units  1,000 Units Oral Daily Carroll Sage, MD   1,000 Units at 09/02/18 [phone]  . enoxaparin (LOVENOX) injection 40 mg  40 mg Subcutaneous Q24H Helberg, Justin, MD      . feeding supplement (ENSURE ENLIVE) (ENSURE ENLIVE) liquid 237 mL  237 mL Oral BID BM Erle Crocker, MD   237 mL at 09/02/18 0934  . HYDROmorphone (DILAUDID) injection 0.5 mg  0.5 mg Intravenous Q6H PRN Mosetta Anis, MD      . ibuprofen (ADVIL,MOTRIN) tablet 400 mg  400 mg Oral Q6H PRN Erle Crocker, MD   400 mg at 09/02/18 0541  . Influenza vac split quadrivalent PF (FLUARIX) injection 0.5 mL  0.5 mL Intramuscular Tomorrow-1000 Erle Crocker, MD      . OLANZapine [facility]) tablet 5 mg  5 mg Oral BID PRN Erle Crocker, MD   5 mg at 08/31/18 0900  . oxyCODONE (Oxy IR/ROXICODONE) immediate release tablet 5 mg  5 mg Oral Q4H Helberg, Justin, MD      . pantoprazole (PROTONIX) EC tablet 40 mg  40 mg Oral Daily Erle Crocker, MD   40 mg at 09/02/18 0934  . polyethylene glycol (MIRALAX / GLYCOLAX) packet 17 g  17 g Oral Daily Helberg, Justin, MD        Lab Results:  Results for orders placed or performed during the hospital encounter of 08/24/18 (from the past 48 hour(s))  VITAMIN D 25 Hydroxy (Vit-D Deficiency, Fractures)     Status: Abnormal   Collection Time: 09/01/18  3:13 AM  Result Value Ref Range   Vit D, 25-Hydroxy 24.1 (L) 30.0 - 100.0 ng/mL    Comment: (NOTE) Vitamin D deficiency has been defined by the [place] practice guideline as a level of serum 25-OH vitamin D less than 20 ng/mL (1,2). The Endocrine Society went on to further define vitamin D insufficiency as a level between 21 and 29 ng/mL (2). 1. IOM (Institute of Medicine). 2010. Dietary reference   intakes for calcium and D.  [place]: The   [employer]. 2. Holick MF, Binkley [place] Kun[place], Bischoff-Ferrari HA, et al.   Evaluation, treatment, and prevention of vitamin D   deficiency: an Endocrine Society clinical practice   guideline. JCEM. 2011 Jul; 96(7):1911-30. Performed At: [facility] [place], [place] [id number] Rush Farmer MD XY:[id number]   Basic metabolic panel     Status: Abnormal   Collection Time: 09/01/18  3:13 AM  Result Value  Ref Range   Sodium 134 (L) 135 - 145 mmol/L   Potassium 3.6 3.5 - 5.1 mmol/L   Chloride 98 98 - 111 mmol/L   CO2 24 22 - 32 mmol/L   Glucose, Bld 73 70 - 99 mg/dL   BUN 8 6 - 20 mg/dL   Creatinine, Ser 0.86 0.44 - 1.00 mg/dL   Calcium 9.2 8.9 - 10.3 mg/dL   GFR calc non Af Amer >60 >60 mL/min   GFR calc Af Amer >60 >60 mL/min    Comment: (NOTE) The eGFR has been calculated using the CKD EPI equation. This calculation has not been validated in all clinical situations. eGFR's persistently <60 mL/min signify possible Chronic Kidney Disease.    Anion gap 12 5 - 15    Comment: Performed at [facility] [address], [place], [place] [address]  Basic metabolic panel     Status: Abnormal   Collection Time: 09/02/18  4:04 AM  Result Value Ref Range   Sodium 130 (L) 135 - 145 mmol/L   Potassium 3.7 3.5 - 5.1 mmol/L   Chloride 94 (L) 98 - 111 mmol/L   CO2 25 22 - 32 mmol/L   Glucose, Bld 81 70 - 99 mg/dL   BUN 11 6 - 20 mg/dL   Creatinine, Ser 0.98 0.44 - 1.00 mg/dL   Calcium 8.6 (L) 8.9 - 10.3 mg/dL   GFR calc non Af Amer >60 >60 mL/min   GFR calc Af Amer >60 >60 mL/min    Comment: (NOTE) The eGFR has been calculated using the CKD EPI equation. This calculation has not been validated in all clinical situations. eGFR's persistently <60 mL/min signify possible Chronic Kidney Disease.    Anion gap 11 5 - 15    Comment: Performed at [facility] [address] [place], [place] [address]  CBC with Differential      Status: Abnormal   Collection Time: 09/02/18  4:04 AM  Result Value Ref Range   WBC 7.1 4.0 - 10.5 K/uL   RBC 3.83 (L) 3.87 - 5.11 MIL/uL   Hemoglobin 13.5 12.0 - 15.0 g/dL   HCT 39.2 36.0 - 46.0 %   MCV 102.3 (H) 78.0 - 100.0 fL   MCH 35.2 (H) 26.0 - 34.0 pg   MCHC 34.4 30.0 - 36.0 g/dL   RDW 12.0 11.5 - 15.5 %   Platelets 262 150 - 400 K/uL   Neutrophils Relative % 62 %   Neutro Abs 4.3 1.7 - 7.7 K/uL   Lymphocytes Relative 25 %   Lymphs Abs 1.8 0.7 - 4.0 K/uL   Monocytes Relative 11 %   Monocytes Absolute 0.8 0.1 - 1.0 K/uL   Eosinophils Relative 1 %   Eosinophils Absolute 0.1 0.0 - 0.7 K/uL   Basophils Relative 1 %   Basophils Absolute 0.1 0.0 - 0.1 K/uL   Immature Granulocytes 0 %   Abs Immature Granulocytes 0.0 0.0 - 0.1 K/uL    Comment: Performed at [facility] [address] [place], [place] [address]    Blood Alcohol level:  Lab Results  Component Value Date   [facility] <10 08/28/2018    Musculoskeletal: Strength & Muscle Tone: within normal limits Gait & Station: unable to stand Patient leans: N/A  Psychiatric Specialty Exam: Physical Exam  Nursing note and vitals reviewed. Constitutional: She is oriented to person, place, and time. She appears well-developed and well-nourished.  HENT:  Head: Normocephalic and atraumatic.  Neck:  Normal range of motion.  Respiratory: Effort normal.  Musculoskeletal: Normal range of motion.  Neurological: She is alert and oriented to person, place, and time.  Psychiatric: She has a normal mood and affect. Her speech is normal and behavior is normal. Judgment and thought content normal. Cognition and memory are normal.    Review of Systems  Cardiovascular: Negative for chest pain.  Gastrointestinal: Positive for abdominal pain and nausea. Negative for constipation, diarrhea and vomiting.  Psychiatric/Behavioral: Negative for hallucinations and suicidal ideas. The patient does not have insomnia.   All other systems  reviewed and are negative.   Blood pressure (!) 100/58, pulse 66, temperature 97.8 F (36.6 C), temperature source Oral, resp. rate 14, height 5' 3"  (1.6 m), weight 58.3 kg, SpO2 93 %.Body mass index is 22.78 kg/m.  General Appearance: Fairly Groomed, middle aged, Caucasian female, wearing a hospital gown with a right leg cast and lying in bed. NAD.   Eye Contact:  Good  Speech:  Clear and Coherent and Normal Rate  Volume:  Normal  Mood:  Euthymic  Affect:  Appropriate and Congruent  Thought Process:  Goal Directed, Linear and Descriptions of Associations: Intact  Orientation:  Full (Time, Place, and Person)  Thought Content:  Logical  Suicidal Thoughts:  No  Homicidal Thoughts:  No  Memory:  Immediate;   Good Recent;   Good Remote;   Good  Judgement:  Fair  Insight:  Fair  Psychomotor Activity:  Normal  Concentration:  Concentration: Good and Attention Span: Good  Recall:  Good  Fund of Knowledge:  Good  Language:  Good  Akathisia:  No  Handed:  Right  AIMS (if indicated):   N/A  Assets:  Communication Skills Desire for Improvement Housing Social Support  ADL's:  Intact  Cognition:  WNL  Sleep:    Fair    Assessment:  Coni Homesley is a 57 y.o. female who was admitted with closed fracture of right distal fibula. She is alert and oriented to person, place and time today. She is appropriate in behavior and organized in thought process. Her periods of confusion and agitation appear to wax and wane which is more consistent with delirium or sundowning (confusion in the evening per chart review). MOCA testing was completed and indicated moderate cognitive impairment. She may benefit from neuropsychological testing as an outpatient. Recommend continuing Zyprexa PRN for agitation. She denies SI, HI or AVH and does not warrant inpatient psychiatric hospitalization.    Treatment Plan Summary: -Continue Zyprexa 5 mg BID PRN for agitation in the setting of altered mental status.  Can briefly schedule Zyprexa 5 mg qhs if needed for persistent agitation/confusion in setting of concurrent use of deliriogenic medications.  -EKG reviewed and QTc 542 on 9/23. Please closely monitor when starting or increasing QTc prolonging agents. Recommend repeating EKG.  -Psychiatry will sign off on patient at this time. Please consult psychiatry again as needed.    Faythe Dingwall, DO 09/02/2018, 11:53 AM

## 2018-09-03 DIAGNOSIS — G3184 Mild cognitive impairment, so stated: Secondary | ICD-10-CM

## 2018-09-03 DIAGNOSIS — X58XXXA Exposure to other specified factors, initial encounter: Secondary | ICD-10-CM

## 2018-09-03 LAB — BASIC METABOLIC PANEL
Anion gap: 11 (ref 5–15)
BUN: 19 mg/dL (ref 6–20)
CHLORIDE: 90 mmol/L — AB (ref 98–111)
CO2: 30 mmol/L (ref 22–32)
CREATININE: 0.87 mg/dL (ref 0.44–1.00)
Calcium: 8.8 mg/dL — ABNORMAL LOW (ref 8.9–10.3)
GFR calc Af Amer: >60 mL/min (ref >60)
GFR calc non Af Amer: >60 mL/min (ref >60)
GLUCOSE: 94 mg/dL (ref 70–99)
Potassium: 3.3 mmol/L — ABNORMAL LOW (ref 3.5–5.1)
SODIUM: 131 mmol/L — AB (ref 135–145)

## 2018-09-03 NOTE — Clinical Social Work Note (Signed)
Clinical Social Work Assessment  Patient Details  Name: Sonya Cohen MRN: [id number] Date of Birth: July 31, 1961  Date of referral:  09/03/18               Reason for consult:  Facility Placement, Discharge Planning                Permission sought to share information with:  Family Supports, Magazine features editor Permission granted to share information::  No(pt disoriented)  Name::     Harley Hallmark; Oletta Darter  Agency::  SNFs  Relationship::  sister and son  Contact Information:  [phone]; [phone]  Housing/Transportation Living arrangements for the past 2 months:  Hotel/Motel Source of Information:  Siblings Patient Interpreter Needed:  None Criminal Activity/Legal Involvement Pertinent to Current Situation/Hospitalization:  No - Comment as needed Significant Relationships:  Adult Children, Siblings Lives with:  Self Do you feel safe going back to the place where you live?  No Need for family participation in patient care:  Yes (Comment)  Care giving concerns:  Pt has been living alone at a motel, with changes in her mentation noted by her family and former spouse. Pt family lives in multiple other states and there is not anyone nearby to support pt except former spouse. Pt uninsured, requiring assistance with ADLs and IADLs. Pt will need LOG in order to facilitate needed therapies and recommended level of supervision.    Social Worker assessment / plan:  CSW spoke with pt sister on the phone, pt sister states that pt had been living alone at a motel with supervision and support from her former spouse. Pt has a son that lives in [place] and is one of nine siblings. Pt sister states that pt former spouse and family had begun to note some changes in mentation and then after pt fell and came to the hospital they continue to note differences in her demeanor and phone presence. Pt sister aware pt requiring assistance with ADLs and IADLs and is worried that pt  will not have support and supervision she needs if she returns to the motel.  CSW discussed recommendations, barriers to rehab placement and letter of guarantee. Pt sister states understanding of barriers and understands that choices may be limited due to lack of insurance. CSW answered pt sisters questions regarding mental health services in SNF level care (of which these are limited to oftentimes a Child psychotherapist in the facility). Stressed that this is a short term placement and that pt family are encouraged to begin brainstorming and formulating assistance for pt at discharge from SNF.   CSW continues to search for placement for pt- pt sister requests that pt son Jill Alexanders be primary point of contact for hospital employees.  Employment status:  Disabled (Comment on whether or not currently receiving Disability) Insurance information:  Self Pay (Medicaid Pending) PT Recommendations:  24 Hour Supervision, Skilled Nursing Facility Information / Referral to community resources:  Skilled Nursing Facility  Patient/Family's Response to care:  Pt sister states understanding of recommendations, barriers to discharge and disposition.   Patient/Family's Understanding of and Emotional Response to Diagnosis, Current Treatment, and Prognosis:  Pt sister states understanding of diagnosis, current treatment and prognosis- however she maintains that pt mentation remains confused and "shes not her normal self." Pt sister emotionally appropriate via telephone and appreciative of care here at the hospital.   Emotional Assessment Appearance:  Appears stated age Attitude/Demeanor/Rapport:  Unable to Assess Affect (typically observed):  Unable to Assess Orientation:  Fluctuating  Orientation (Suspected and/or reported Sundowners), Oriented to Self, Oriented to Place, Oriented to  Time, Oriented to Situation Alcohol / Substance use:  Not Applicable Psych involvement (Current and /or in the community):  No  (Comment)  Discharge Needs  Concerns to be addressed:  Care Coordination Readmission within the last 30 days:  No Current discharge risk:  Cognitively Impaired, Physical Impairment, Inadequate Financial Supports, Homeless, Lives alone Barriers to Discharge:  Continued Medical Work up, Inadequate or no insurance, No SNF bed   [employer], LCSWA 09/03/2018, 5:08 PM

## 2018-09-03 NOTE — Op Note (Signed)
Sonya Cohen female 57 y.o. 09/03/2018  PreOperative Diagnosis: Right lateral malleolus fracture  PostOperative Diagnosis: Right lateral malleolus fracture with syndesmotic disruption  Procedure(s) and Anesthesia Type:    * OPEN TREATMENT OF RIGHT LATERAL MALLEOLUS - General  Surgeon: Terance Hart   Assistants: None  Anesthesia: General LMA anesthesia  ASA Class: none  Findings: Right lateral malleolus fracture Syndesmotic ligament disruption  Implants: Arthrex locking one third tubular plate  [name]:57 y.o. female who had a fall and sustained a right ankle fracture.  This was unstable.  She was admitted to the hospital due to agitation and psychiatric issues.  Based on her x-rays and discussion with the patient she was indicated for surgical intervention.  After discussion with the patient and her family as well as psychiatric evaluation she was deemed competent to make medical decisions and therefore we discussed the risks, benefits and alternatives to undergoing surgical intervention.  The risks included but were not limited to wound healing complications, infection, nonunion, need for second surgery, damage to surrounding structures, less than optimal outcome.  After weighing these risks she opted to proceed with surgical correction of her ankle fracture.  Procedure Detail: The patient was identified in the preoperative holding area and the right lower extremity was identified as the appropriate operative extremity and marked by myself and the patient.  The consent was signed after review demonstrated appropriate surgical procedure to be performed.  She was taken to the OR suite and placed in the supine position on the operative table.  All bony prominences were checked and well padded.  A small bump was placed under the right hip.  The right lower extremity was prepped and draped in usual sterile fashion.  A surgical timeout was performed identifying the right  lower extremity as the appropriate operative extremity.  I began with making an incision over the distal fibula approximately 7 cm in length.  This was taken sharply down through skin subtenons tissues.  Then bluntly I dissected down identifying the superficial peroneal nerve and this was retracted and protected for the case.  Then I sharply incised over the fracture site and cleaned off the imposed periosteum and hematoma.  This was done with a rondure and curette as well as sharply with a scalpel.  Then using a lobster claw I was able to acceptably reduce the distal fibula fracture and confirmed this on intraoperative fluoroscopy.  Then a 2.78mm lag screw was placed in a lag by technique fashion.  Then a 7 hole one third tubular plate was placed.  Distally I placed locking screws proximally placed cortical screws.  Appropriate reduction was again confirmed on fluoroscopy.  I then dissected anteriorly at the syndesmosis and noted that the syndesmotic ligaments were disrupted.  On external stress test I could see that the syndesmosis was widening with direct visualization.  I then used a Weber clamp and clamped across the syndesmosis and directly visualized appropriate reduction of the incisura.  Then placed 1 syndesmotic screw completing the open treatment of the syndesmosis.  Then appropriate ankle mortise was again confirmed on fluoroscopy.  The wound was irrigated with copious amounts of saline and the deep tissue was closed with 2-0 PDS.  The subcutaneous tissue was closed with 3-0 Monocryl in the skin with staples.  She was placed in a short leg splint.  Prior to dressing placement the counts were all correct.  The patient was then awoken from anesthesia and taken to recovery room without issue.  Postop instructions:  Patient will remain nonweightbearing in the right lower examinee.  She will elevate the extremity with the heel floating.  She will maintain the splint and keep it clean and dry.  She  will follow-up with me in 2 weeks for splint removal, wound check and postoperative x-rays.  She will take Lovenox for 2 weeks post discharge.  Tourniquette Time: Unknown  Estimated Blood Loss:  Minimal         Drains: none  Blood Given: none         Specimens: none       Complications:  * No complications entered in OR log *         Disposition: PACU - hemodynamically stable.         Condition: stable

## 2018-09-03 NOTE — Progress Notes (Signed)
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  She states she retired from her job 2 or 3 months ago and has been traveling and moving between [place], [place], and [place].  She states she just returned to [place] 1 to 2 weeks ago.  1 week ago she had sudden onset of nausea and vomiting.  This is associated with left lower quadrant pain and one episode of diarrhea.  Subsequently, she has been drinking lots of water and has experienced diaphoresis, lightheadedness, palpitations, lower extremity weakness, fatigue, and dyspnea.  She felt prior to admission at home because her legs felt weak and painful.  This resulted in right ankle pain.  In the ED, she was found to be hypertensive, hyponatremic, with a lactic acidosis, and a right distal fibula fracture, now splinted.    PT Comments    Pt eager to work with therapy and overly appreciative for Physical Therapy. Pt progressing towards her goals and much better at maintaining R LE NWB. Pt performed bed exercises prior to ambulation. Pt utilized hop to pattern to advance 9 feet forward with 1x standing rest break prior to returning to bed. Pt request return to bed and pain medication at end of session. D/c plans remain appropriate    Follow Up Recommendations  SNF     Equipment Recommendations  Other (comment)(TBD at next venue)    Recommendations for Other Services       Precautions / Restrictions Precautions Precautions: Fall Restrictions Weight Bearing Restrictions: Yes RLE Weight Bearing: Non weight bearing(pt able to state that she is non weightbearing on her R LE)    Mobility  Bed Mobility Overal bed mobility: Needs Assistance Bed Mobility: Sit to Supine     Supine to sit:  Supervision Sit to supine: Supervision   General bed mobility comments: supervision for safety with LE management back to bed  Transfers Overall transfer level: Needs assistance Equipment used: Rolling walker (2 wheeled) Transfers: Sit to/from Stand Sit to Stand: Min guard         General transfer comment: min guard for safety, vc for hand positioning and for maintaining R LE NWB,   Ambulation/Gait Ambulation/Gait assistance: Min guard Gait Distance (Feet): 16 Feet   Gait Pattern/deviations: Step-to pattern;Trunk flexed(hop to pattern) Gait velocity: slowed Gait velocity interpretation: <1.31 ft/sec, indicative of household ambulator General Gait Details: min guard for safety, pt with increased UE fatigue and ankle pain limiting ambulation            Balance Overall balance assessment: Needs assistance Sitting-balance support: Feet supported;Single extremity supported Sitting balance-Leahy Scale: Fair     Standing balance support: Bilateral upper extremity supported Standing balance-Leahy Scale: Poor                              Cognition Arousal/Alertness: Awake/alert Behavior During Therapy: Anxious;Impulsive Overall Cognitive Status: Difficult to assess                                 General Comments: pt more appropriate today, overly appreciative      Exercises General Exercises - Lower Extremity Short Arc Quad: Supine;Both;10 reps;AROM Heel Slides: AROM;10 reps;Supine;Both Hip ABduction/ADduction:  AROM;Both;10 reps;Supine Straight Leg Raises: AROM;Both;10 reps;Supine    General Comments   VSS     Pertinent Vitals/Pain Pain Assessment: 0-10 Pain Score: 8  Faces Pain Scale: Hurts even more Pain Descriptors / Indicators: Aching;Throbbing Pain Intervention(s): Limited activity within patient's tolerance;Monitored during session;Repositioned;Patient requesting pain meds-RN notified           PT Goals (current goals can  now be found in the care plan section) Acute Rehab PT Goals Patient Stated Goal: get out of here PT Goal Formulation: With patient Time For Goal Achievement: 09/11/18 Potential to Achieve Goals: Fair Progress towards PT goals: Progressing toward goals    Frequency    Min 2X/week      PT Plan Current plan remains appropriate       AM-PAC PT "6 Clicks" Daily Activity  Outcome Measure  Difficulty turning over in bed (including adjusting bedclothes, sheets and blankets)?: A Little Difficulty moving from lying on back to sitting on the side of the bed? : A Little Difficulty sitting down on and standing up from a chair with arms (e.g., wheelchair, bedside commode, etc,.)?: Unable Help needed moving to and from a bed to chair (including a wheelchair)?: A Little Help needed walking in hospital room?: A Lot Help needed climbing 3-5 steps with a railing? : Total 6 Click Score: 13    End of Session Equipment Utilized During Treatment: Gait belt Activity Tolerance: Patient tolerated treatment well Patient left: in bed;with call bell/phone within reach;with nursing/sitter in room Nurse Communication: Mobility status PT Visit Diagnosis: Unsteadiness on feet (R26.81);Other abnormalities of gait and mobility (R26.89);Muscle weakness (generalized) (M62.81);Difficulty in walking, not elsewhere classified (R26.2);Other symptoms and signs involving the nervous system (Z61.096)     Time: 1040-1056 PT Time Calculation (min) (ACUTE ONLY): 16 min  Charges:  $Gait Training: 8-22 mins                     Sonya Cohen Sonya Cohen PT, DPT Acute Rehabilitation Services Pager [phone] Office [phone]    Sonya Cohen Fleet 09/03/2018, 11:16 AM

## 2018-09-03 NOTE — Progress Notes (Signed)
Subjective: 2 Days Post-Op Procedure(s) (LRB): OPEN TREATMENT OF RIGHT LATERAL MALLEOLUS (Right)  Open Treatment syndesmosis  Patient reports better pain control this morning.  Said yesterday was pretty painful but it has improved.  She denies any nausea.  No shortness of breath.  She is been maintaining nonweightbearing.  She is hopeful for discharge tomorrow.   Exam: Awake and alert. Right lower extremity in a splint.  Toes are warm well perfused.  She can wiggle toes.  Endorses sensation over toes to light touch.  No pain with calf squeeze proximal to the splint.  No knee pain.  Assessment/Plan: 2 Days Post-Op Procedure(s) (LRB): OPEN TREATMENT OF RIGHT LATERAL MALLEOLUS (Right)  Open treatment of syndesmosis.  Overall she seems to be doing well.  Tolerating the splint.  Maintaining nonweightbearing.  Patient will maintain nonweightbearing on the right lower extremity.   Continue Lovenox for DVT prophylaxis.  Pain control as needed.  Discharge to SNF when cleared by medical team. Continue work with physical therapy while in house. Keep splint clean and dry.  She will follow-up with me in 2 weeks for splint removal, x-rays and wound check.     Terance Hart 09/03/2018, 8:39 AM

## 2018-09-03 NOTE — Progress Notes (Addendum)
   Subjective: Sonya Cohen reports improvement in her pain with scheduled oxycodone 5 mg q4h. She has not had any PRN IV Dilaudid for over 24 hours. She stopped vomiting after receiving promethazine yesterday and does not report any current nausea.. Her last bowel movement was yesterday where she had multiple episodes of diarrhea. She has not had a BM since. She denies any other acute complaints.   Objective:  Vital signs in last 24 hours: Vitals:   09/02/18 1711 09/02/18 2346 09/03/18 0500 09/03/18 0905  BP: 115/81 (!) 84/55  114/74  Pulse: (!) 121 (!) 106  97  Resp:  16    Temp:  98.4 F (36.9 C)  98.1 F (36.7 C)  TempSrc:  Oral  Oral  SpO2:  100%  100%  Weight:   53.1 kg   Height:       Physical Exam  Constitutional:  Sitting up in bed in no acute distress.  HENT:  Head: Normocephalic and atraumatic.  Eyes: Conjunctivae and EOM are normal. Right eye exhibits no discharge. Left eye exhibits no discharge.  Neck: Normal range of motion. Neck supple.  Cardiovascular: Regular rhythm, normal heart sounds and intact distal pulses.  Pulmonary/Chest: Effort normal and breath sounds normal.  Abdominal: Soft. Bowel sounds are normal.  Musculoskeletal:  No edema noted to the left leg with no tenderness to palpation. Right leg is wrapped in surgical bandages.  Neurological: She is alert.  MOCA test performed. She scored a 22/30 (improved from an 18/30 several days ago).  Skin: Skin is warm and dry.  Psychiatric: Affect and judgment normal.  Nursing note and vitals reviewed.   Assessment/Plan:  Principal Problem:   Delirium Active Problems:   Closed fracture of right distal fibula   Pressure injury of skin   Adjustment disorder with mixed disturbance of emotions and conduct  Right Distal Fibula Fracture s/p ORIF: Her post-op pain is controlled today with scheduled Oxy IR 5 mg.  - Continue Oxycodone 5 mg IR q4h - Continue both Tylenol 650 mg qh6 PRN moderate pain, Ibuprofen  400 mg q6h PRN moderate pain. - Continue PT/OT - Awaiting SNF placement  Adjustment disorder with mixed disturbance of emotions and conduct: - Continue Zyprexa 5 mg BID prn agitation. - MOCA performed today is improved to 22/30 but does indicate some mild cognitive impairment.  Dispo: Anticipated discharge in approximately 1-2 days to SNF.  Synetta Shadow, MD 09/03/2018, 2:38 PM Pager: [phone]

## 2018-09-04 ENCOUNTER — Encounter (HOSPITAL_COMMUNITY): Payer: Self-pay | Admitting: Internal Medicine

## 2018-09-04 DIAGNOSIS — F339 Major depressive disorder, recurrent, unspecified: Secondary | ICD-10-CM

## 2018-09-04 DIAGNOSIS — L8990 Pressure ulcer of unspecified site, unspecified stage: Secondary | ICD-10-CM

## 2018-09-04 DIAGNOSIS — N179 Acute kidney failure, unspecified: Secondary | ICD-10-CM

## 2018-09-04 LAB — BASIC METABOLIC PANEL
ANION GAP: 12 (ref 5–15)
BUN: 9 mg/dL (ref 6–20)
CHLORIDE: 90 mmol/L — AB (ref 98–111)
CO2: 30 mmol/L (ref 22–32)
Calcium: 9.2 mg/dL (ref 8.9–10.3)
Creatinine, Ser: 0.71 mg/dL (ref 0.44–1.00)
GFR calc Af Amer: >60 mL/min (ref >60)
GFR calc non Af Amer: >60 mL/min (ref >60)
GLUCOSE: 107 mg/dL — AB (ref 70–99)
POTASSIUM: 3 mmol/L — AB (ref 3.5–5.1)
Sodium: 132 mmol/L — ABNORMAL LOW (ref 135–145)

## 2018-09-04 MED ORDER — OXYCODONE HCL 5 MG PO TABS: 5.0000 mg | ORAL_TABLET | ORAL | 0 refills | Status: DC | PRN

## 2018-09-04 MED ORDER — POLYETHYLENE GLYCOL 3350 17 G PO PACK: 17.0000 g | PACK | Freq: Every day | ORAL | 0 refills | Status: AC | PRN

## 2018-09-04 MED ORDER — ACETAMINOPHEN 325 MG PO TABS: 650.0000 mg | ORAL_TABLET | Freq: Four times a day (QID) | ORAL | 2 refills | Status: AC | PRN

## 2018-09-04 MED ORDER — CALCIUM CARBONATE-VITAMIN D 500-200 MG-UNIT PO TABS: 1.0000 | ORAL_TABLET | Freq: Two times a day (BID) | ORAL | 2 refills | Status: AC

## 2018-09-04 MED ORDER — OXYCODONE HCL 5 MG PO TABS: 5.0000 mg | ORAL_TABLET | ORAL | 0 refills | Status: AC | PRN

## 2018-09-04 MED ORDER — OLANZAPINE 5 MG PO TABS: 5.0000 mg | ORAL_TABLET | Freq: Two times a day (BID) | ORAL | 0 refills | Status: AC | PRN

## 2018-09-04 MED ORDER — ADULT MULTIVITAMIN W/MINERALS CH: 1.0000 | ORAL_TABLET | Freq: Every day | ORAL | 2 refills | Status: AC

## 2018-09-04 NOTE — Care Management Note (Signed)
Case Management Note  Patient Details  Name: Sonya Cohen MRN: [id number] Date of Birth: 11/25/1961  Subjective/Objective:  From home alone, hypokalemia, hyponatremia, RLE fibulia fx, patient states she has a PCP at [facility] here in [place]. And she can schedule her follow up apt.   9/25 Letha Cape RN, BSN - today NCM asked to see patient's insurance card she states " I dont have it yet ,but I am getting it."  NCM informed her that she will need ast with PCP and medications prior to dc, so NCM will  Need to set her up an apt to follow up with MD and will asist her with a Match Letter for her medications at discharge, she was in agreement.  Follow up apt set up with [facility] and Match letter given to patient for med asisstance and [facility] clinic brochure given to patient also. Not sure how much she is comprehending this information.      10/4 Letha Cape RN, BSN -  Patient will be going to SNF at dc with LOG, Match letter has expired, she did not get to use it. IF she is readmitted again and needs a Match, will need to reinstate her.  CSW following for placement.                   Action/Plan: DC to SNF when medically ready.  Expected Discharge Date:                  Expected Discharge Plan:  Skilled Nursing Facility  In-House Referral:  Clinical Social Work  Discharge planning Services  CM Consult  Post Acute Care Choice:    Choice offered to:     DME Arranged:    DME Agency:     HH Arranged:    HH Agency:     Status of Service:  Completed, signed off  If discussed at [employer], dates discussed:    Additional Comments:  Leone Haven, RN 09/04/2018, 2:14 PM

## 2018-09-04 NOTE — Progress Notes (Signed)
[facility] has accepted patient and is the only SNF to offer to accept a letter of guarantee. Patient's son aware.   Osborne Casco Oralia Criger LCSW [phone]

## 2018-09-04 NOTE — Progress Notes (Signed)
Pt seen by MD, orders written for d/c.  Called report to Mariah Milling, RN at [facility].  Removed IV, no complications.  Escorted for discharge via stretcher by  PTAR with all belongings.  Will follow up outpatient with MD.

## 2018-09-04 NOTE — Progress Notes (Signed)
Pt.was found on floor, sitting with legs extended, stating she fell. The fall was not witnessed, although I was within hearing distance approximately 15 feet. Dillard Cannon RN and myself immediately assessed pt and assisted her back to bed. Pt insisted she was not in any pain. I visually assessed buttocks and found skin color unchanged, no abrasions, swelling or bruising. Pt denied any pain or dizziness. Consulting civil engineer, Dr.Prince notified and pt denied need for calling family.

## 2018-09-04 NOTE — Progress Notes (Signed)
   Subjective: Ms. Narvaez reports that her pain is well-controlled when she gets her pain medications. She denies any new complaints including shortness of breath, chest pain, abdominal pain. She states that she is having a hard time eating all of her meals but believes that it is the type of unappetizing food they are serving. She expresses desire to go to a SNF ASAP and is appropriately upset when I explain to her that we have not found a placement at this time. She is anxious to leave the hospital but understands that it is in her best interest to stay until we find an appropriate place for her.   Objective:  Vital signs in last 24 hours: Vitals:   09/03/18 0500 09/03/18 0905 09/03/18 1717 09/03/18 2319  BP:  114/74 115/77 106/77  Pulse:  97 67 79  Resp:    18  Temp:  98.1 F (36.7 C) 98.2 F (36.8 C) 97.7 F (36.5 C)  TempSrc:  Oral Oral Oral  SpO2:  100% 95% 100%  Weight: 53.1 kg     Height:       Physical Exam  Constitutional: She appears well-developed and well-nourished.  HENT:  Head: Normocephalic and atraumatic.  Eyes: EOM are normal.  Neck: Normal range of motion.  Cardiovascular: Normal rate, regular rhythm and normal heart sounds.  Pulmonary/Chest: Effort normal and breath sounds normal.  Musculoskeletal: Normal range of motion.  No edema noted to the left leg. Right leg is wrapped in bandages.   Skin: Skin is warm and dry.  Psychiatric:  She is appropriately teary when speaking about leaving the hospital.  Nursing note and vitals reviewed.   Assessment/Plan:  Principal Problem:   Closed fracture of right distal fibula s/p ORIF Active Problems:   Adjustment disorder with mixed disturbance of emotions and conduct  Principal Problem:   Delirium Active Problems:   Closed fracture of right distal fibula   Pressure injury of skin   Adjustment disorder with mixed disturbance of emotions and conduct  Right Distal Fibula Fracture s/p ORIF: Her post-op pain  iscontrolled today with scheduled OxyIR5 mg.  -ContinueOxycodone 5 mg IRq4h - Continue bothTylenol 650 mg qh6 PRN moderate pain, Ibuprofen 400 mg q6h PRN moderate pain. - Continue PT/OT - Awaiting SNF placement  Adjustment disorder with mixed disturbance of emotions and conduct: - Continue Zyprexa 5 mg BID prn agitation.  Dispo: Anticipated discharge in approximately 1-2 days to SNF.   Synetta Shadow, MD 09/04/2018, 6:08 AM Pager: [phone]

## 2018-09-04 NOTE — Progress Notes (Signed)
Patient will DC to: [facility] Anticipated DC date: 09/04/18 Family notified: Son, Jill Alexanders Transport by: Audie Clear   Per MD patient ready for DC to [facility]. RN, patient, patient's family, and facility notified of DC. Discharge Summary and FL2 sent to facility. RN to call report prior to discharge [phone]). DC packet on chart. Ambulance transport requested for patient.   CSW will sign off for now as social work intervention is no longer needed. Please consult [place] again if new needs arise.  Cristobal Goldmann, LCSW Clinical Social Worker [phone]

## 2018-09-04 NOTE — Clinical Social Work Placement (Signed)
   CLINICAL SOCIAL WORK PLACEMENT  NOTE  Date:  09/04/2018  Patient Details  Name: Sonya Cohen MRN: [id number] Date of Birth: Jun 27, 1961  Clinical Social Work is seeking post-discharge placement for this patient at the Skilled  Nursing Facility level of care (*CSW will initial, date and re-position this form in  chart as items are completed):  Yes   Patient/family provided with [facility] Clinical Social Work Department's list of facilities offering this level of care within the geographic area requested by the patient (or if unable, by the patient's family).  Yes   Patient/family informed of their freedom to choose among providers that offer the needed level of care, that participate in Medicare, Medicaid or managed care program needed by the patient, have an available bed and are willing to accept the patient.  Yes   Patient/family informed of [facility]'s ownership interest in [facility] and [facility], as well as of the fact that they are under no obligation to receive care at these facilities.  PASRR submitted to EDS on 09/03/18     PASRR number received on 09/04/18     Existing PASRR number confirmed on       FL2 transmitted to all facilities in geographic area requested by pt/family on 09/04/18     FL2 transmitted to all facilities within larger geographic area on       Patient informed that his/her managed care company has contracts with or will negotiate with certain facilities, including the following:        Yes   Patient/family informed of bed offers received.  Patient chooses bed at [facility]     Physician recommends and patient chooses bed at      Patient to be transferred to [facility] on 09/04/18.  Patient to be transferred to facility by PTAR     Patient family notified on 09/04/18 of transfer.  Name of family member notified:  Son, Jill Alexanders     PHYSICIAN Please prepare priority discharge summary, including medications     Additional  Comment:    _______________________________________________ Mearl Latin, LCSW 09/04/2018, 2:50 PM

## 2018-09-23 ENCOUNTER — Other Ambulatory Visit: Payer: Self-pay | Admitting: Internal Medicine

## 2018-09-27 ENCOUNTER — Other Ambulatory Visit: Payer: Self-pay | Admitting: Internal Medicine

## 2018-09-28 ENCOUNTER — Other Ambulatory Visit: Payer: Self-pay | Admitting: Internal Medicine

## 2018-09-29 ENCOUNTER — Inpatient Hospital Stay (INDEPENDENT_AMBULATORY_CARE_PROVIDER_SITE_OTHER): Payer: Self-pay | Admitting: Physician Assistant

## 2018-10-08 ENCOUNTER — Other Ambulatory Visit: Payer: Self-pay | Admitting: *Deleted

## 2019-06-09 IMAGING — DX DG HIP (WITH OR WITHOUT PELVIS) 2-3V*R*
3 series · 3 of 3 positions shown · non-contrast
Comparison: None.

CLINICAL DATA: Mechanical fall 2 days ago, RIGHT hip and ankle pain
since.

EXAM:
DG HIP (WITH OR WITHOUT PELVIS) 2-3V RIGHT

[pelvis ap]
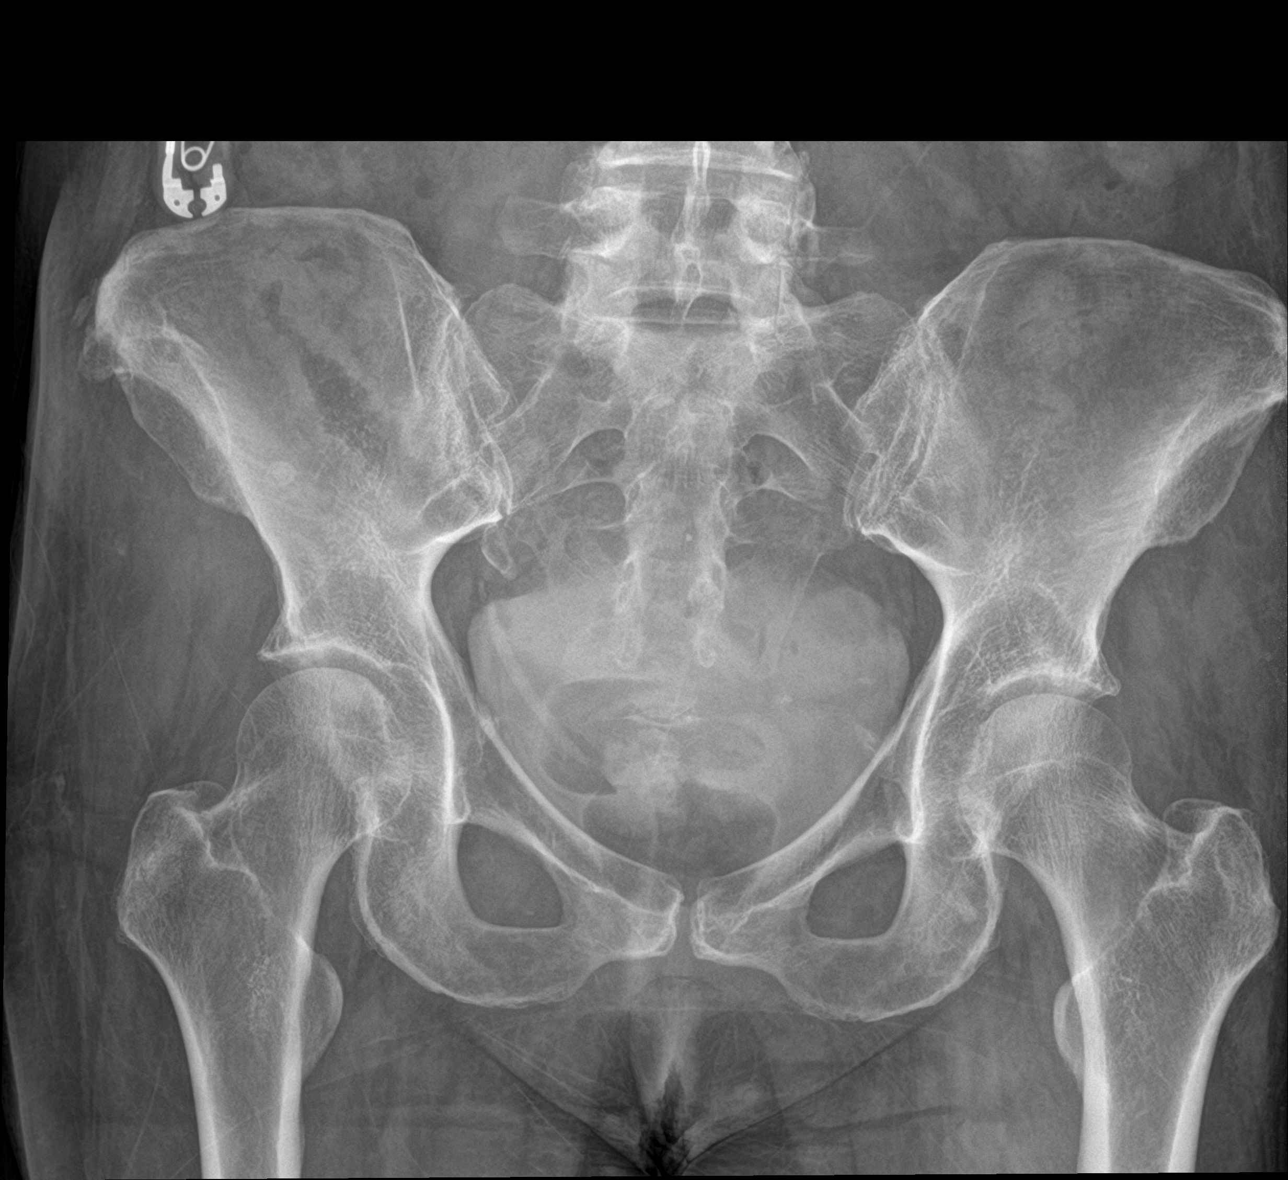

[hip ap]
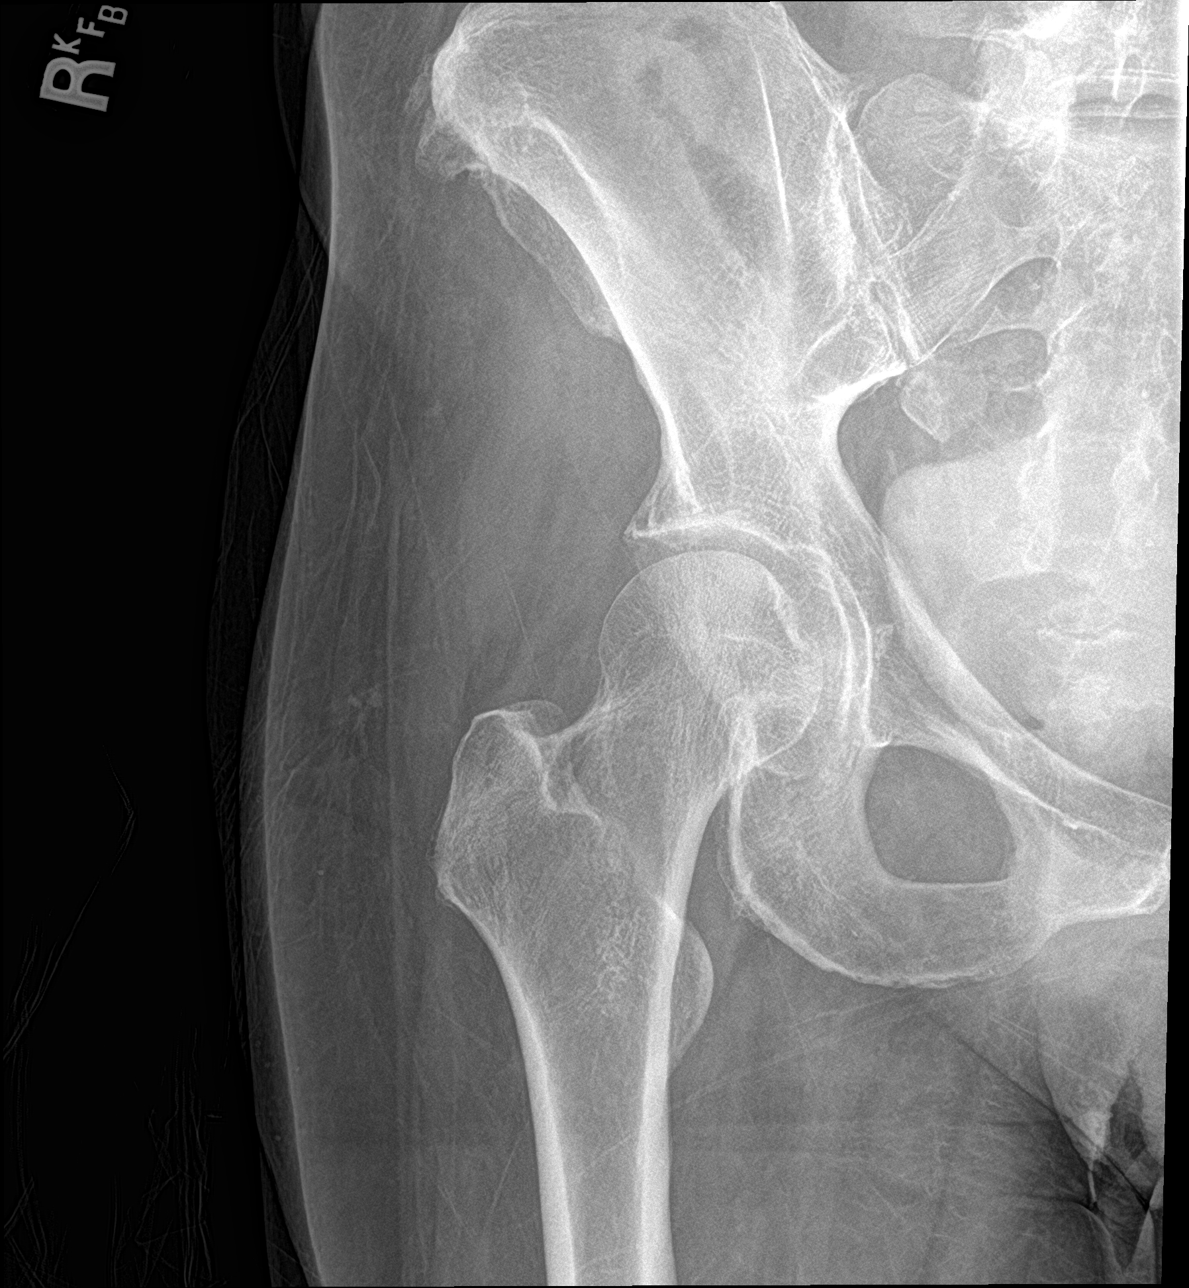

[hip lat]
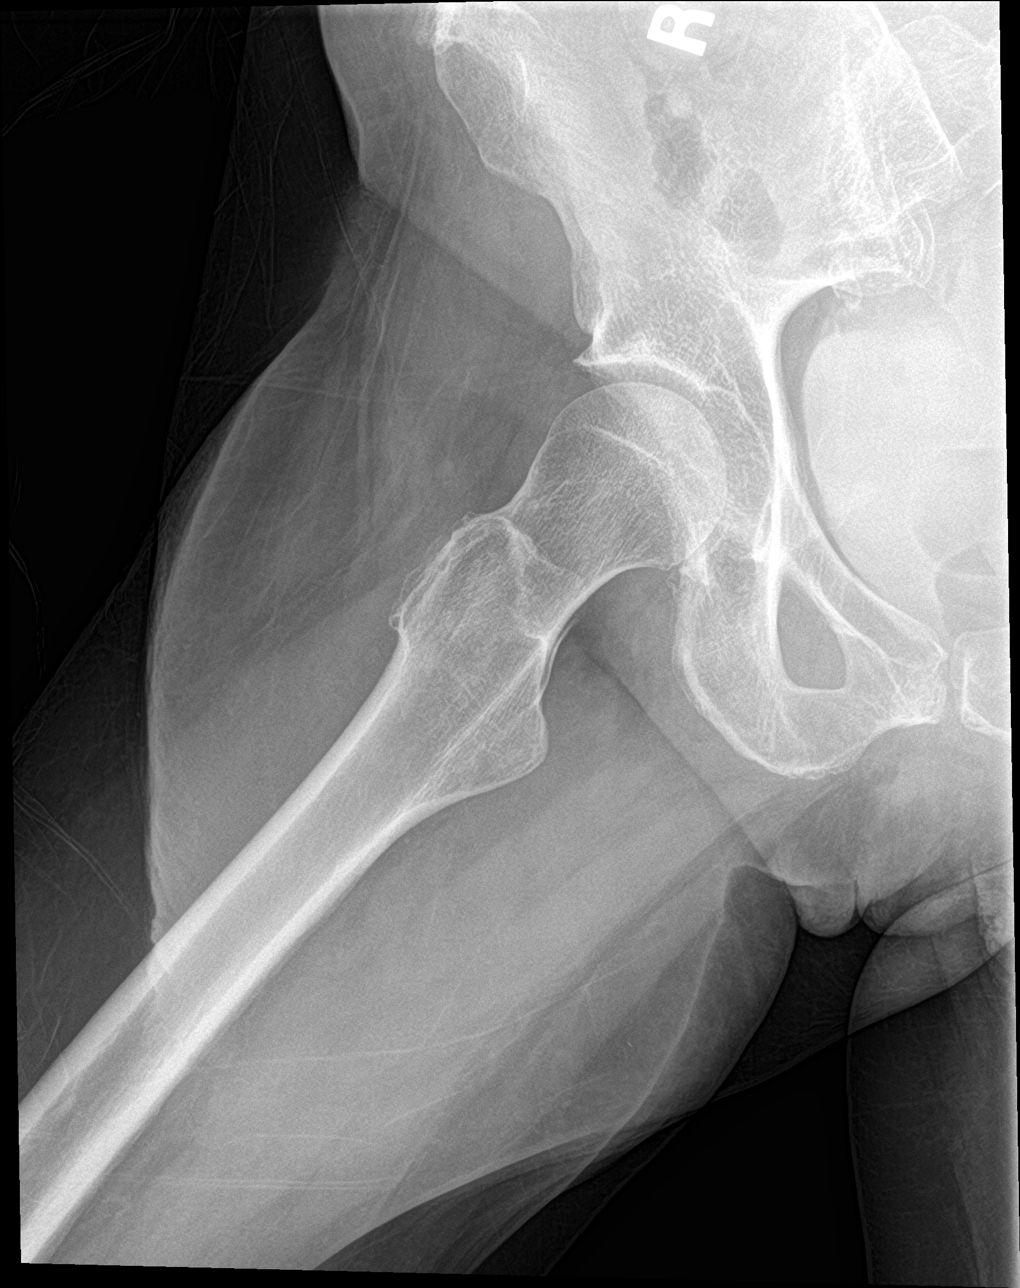

[3 of 3 positions shown; findings below may reference images not displayed]

FINDINGS: There is no evidence of hip fracture or dislocation. There is no
evidence of arthropathy or other focal bone abnormality.
IMPRESSION: Negative.

## 2019-06-09 IMAGING — CT CT ABD-PELV W/ CM
2 of 5 series · 16 of 46 positions shown, 18 images · IV contrast (omnipaque)
Comparison: None.

CLINICAL DATA: Nausea,vomiting and diarrhea With generalized
weakness for 2 days. Reduced dose.

EXAM:
CT ABDOMEN AND PELVIS WITH CONTRAST
TECHNIQUE: Multidetector CT imaging of the abdomen and pelvis was performed
using the standard protocol following bolus administration of
intravenous contrast.
CONTRAST:  75mL OMNIPAQUE IOHEXOL 300 MG/ML  SOLN

[Series 3: a/p w/ 5mm · axial · 0.67mm/px · z∈[-816,-456]mm · 13 of 82 slices shown, 15 images]
[im 5/82  soft-tissue]
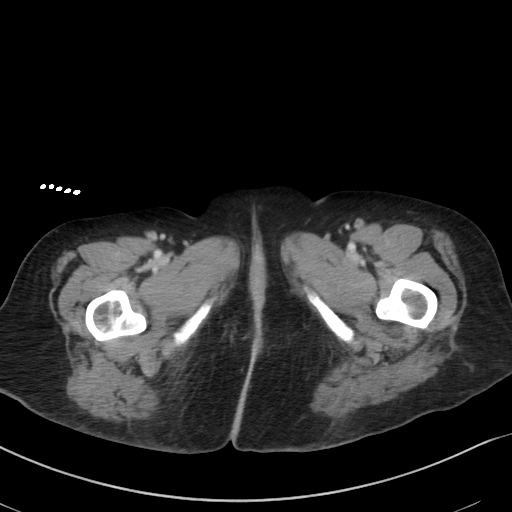
[im 5/82  bone]
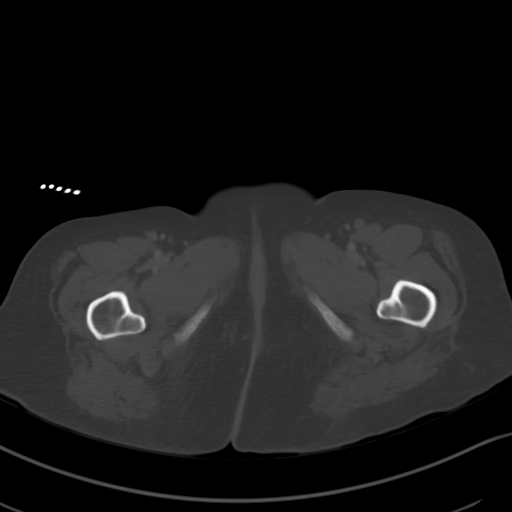
[im 10/82  soft-tissue]
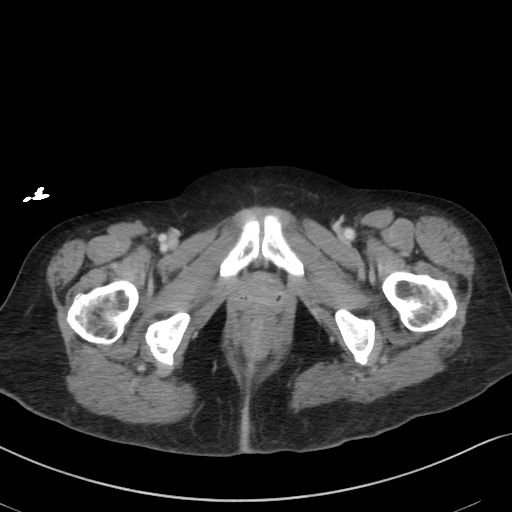
[im 19/82  soft-tissue]
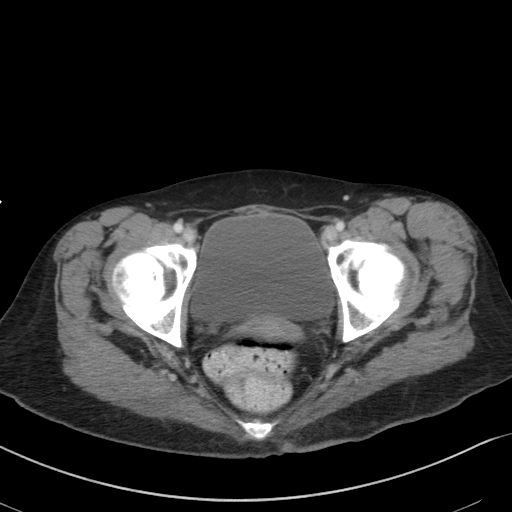
[im 23/82  soft-tissue]
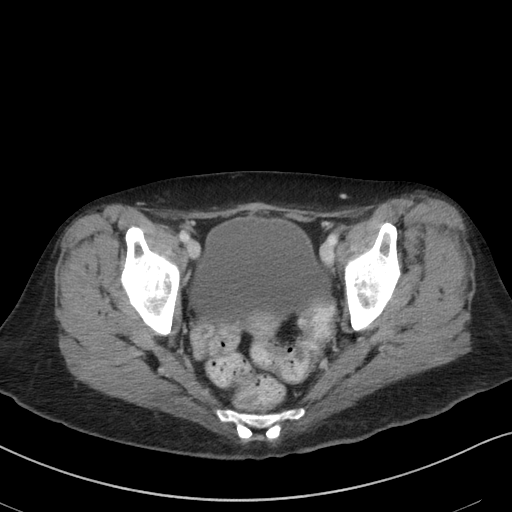
[im 28/82  soft-tissue]
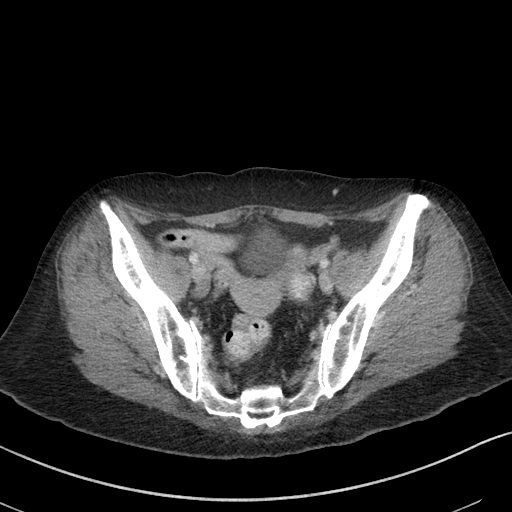
[im 37/82  soft-tissue]
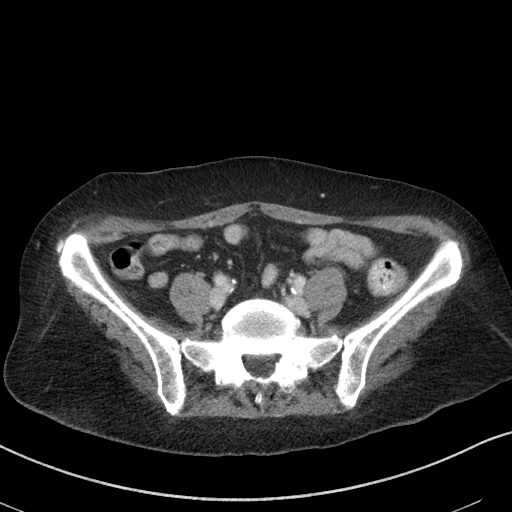
[im 41/82  soft-tissue]
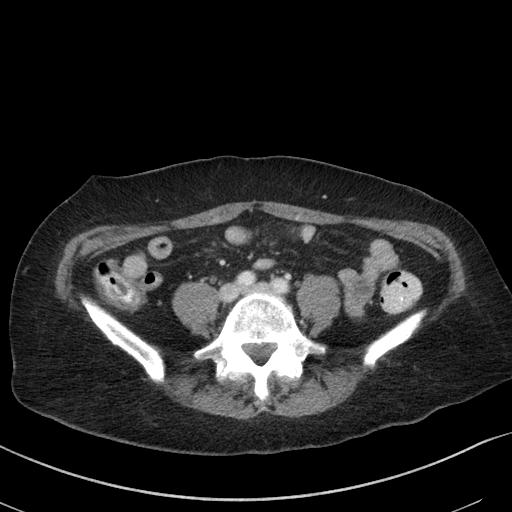
[im 46/82  soft-tissue]
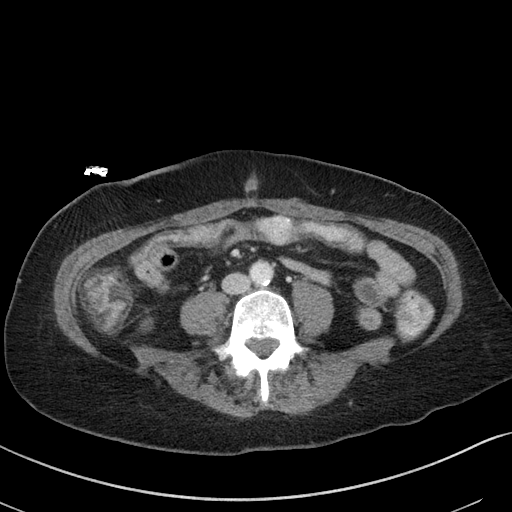
[im 55/82  soft-tissue]
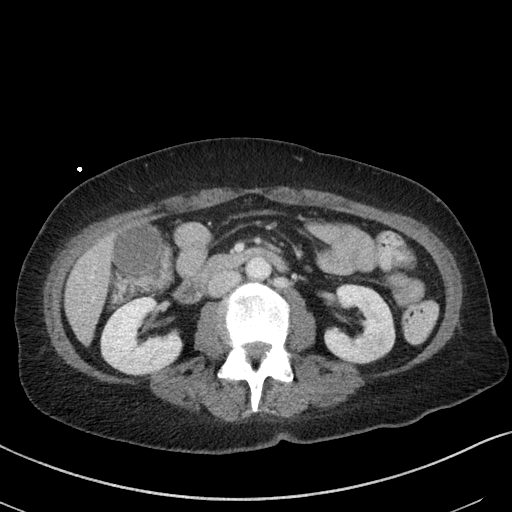
[im 55/82  bone]
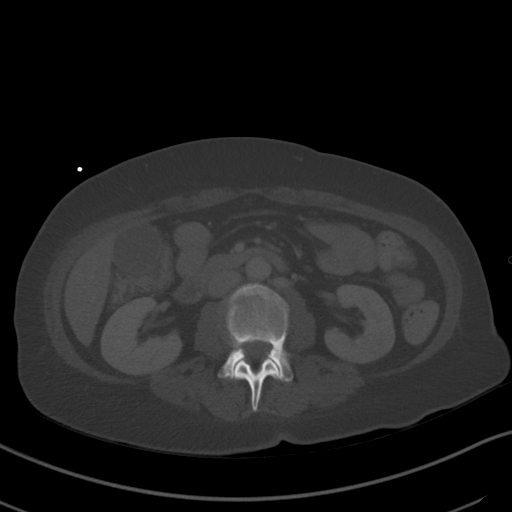
[im 59/82  soft-tissue]
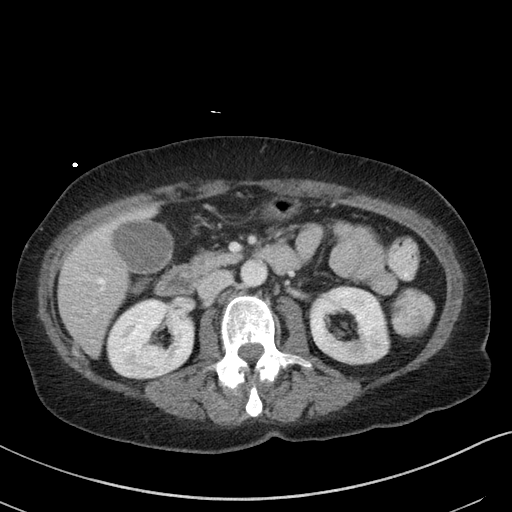
[im 64/82  soft-tissue]
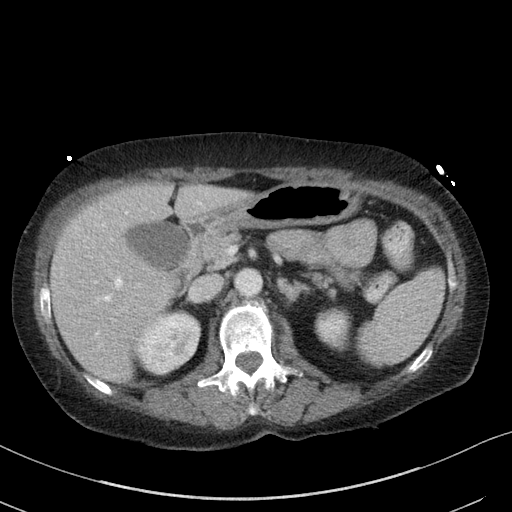
[im 73/82  soft-tissue]
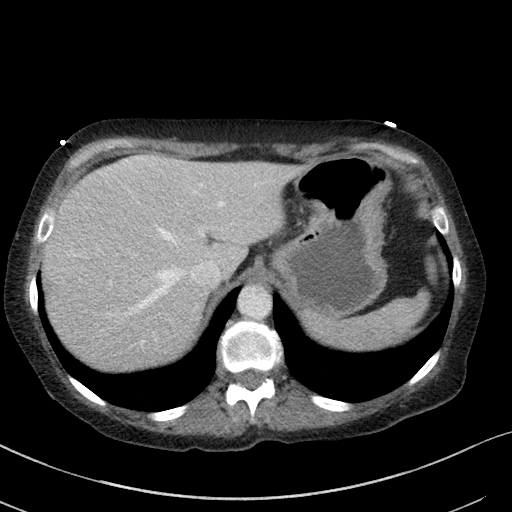
[im 77/82  soft-tissue]
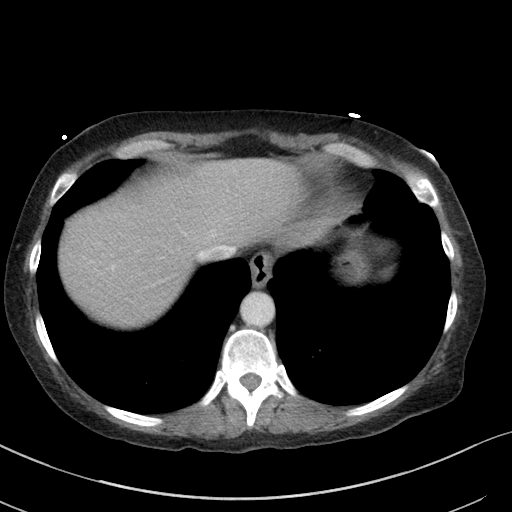

[Series 6: a/p w/ cor · coronal · 0.73mm/px · 3 of 121 slices shown]
[im 41/121  soft-tissue]
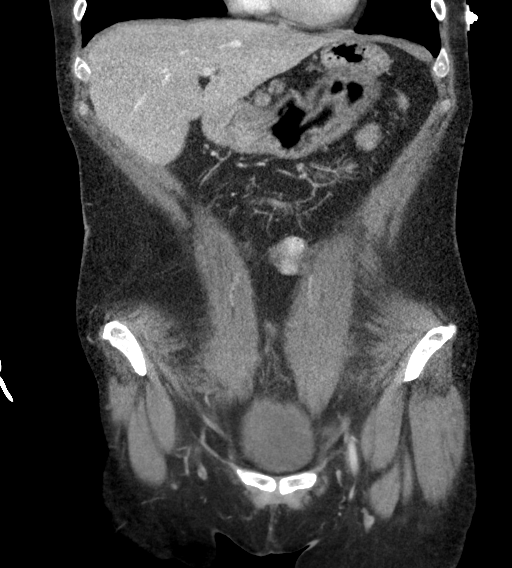
[im 54/121  soft-tissue]
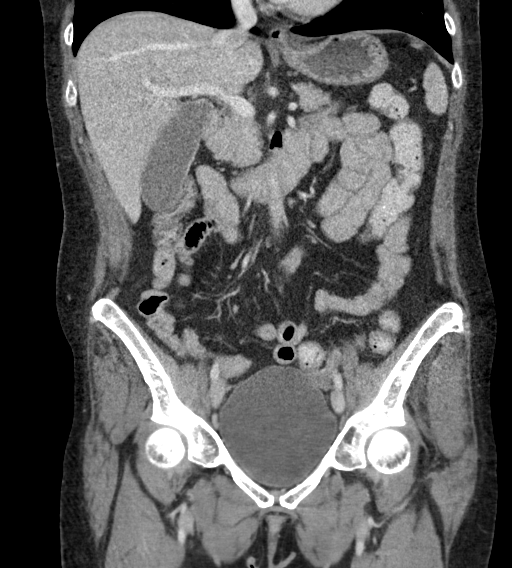
[im 67/121  soft-tissue]
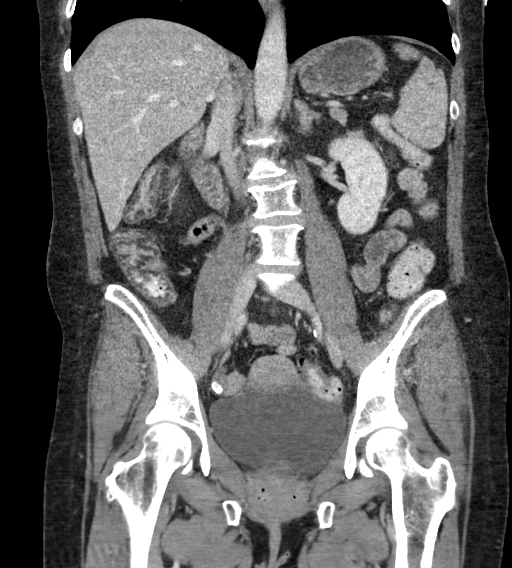

[16 of 46 positions shown; findings below may reference images not displayed]

FINDINGS: Lower chest: Lung bases are clear.

Hepatobiliary: Gallbladder is distended. Otherwise gallbladder is
normal in appearance. Liver is homogeneous without focal mass.

Pancreas: Unremarkable. No pancreatic ductal dilatation or
surrounding inflammatory changes.

Spleen: Normal in size without focal abnormality.

Adrenals/Urinary Tract: Adrenal glands are unremarkable. Kidneys are
normal, without renal calculi, focal lesion, or hydronephrosis.
Bladder is unremarkable.

Stomach/Bowel: Small hiatal hernia. The stomach is otherwise normal
in appearance. Normal appearance of small bowel loops. There is fat
deposition within the wall of the ascending and proximal transverse
colon consistent with chronic inflammatory change. The appendix is
well seen and has a normal appearance.

Vascular/Lymphatic: There is atherosclerotic calcification of the
abdominal aorta not associated with aneurysm.

Reproductive: Uterus is present. Coarse calcification is identified
in the RIGHT adnexal region, raising the question of a possible
dermoid. Ovary is not enlarged.

Other: No abdominal wall hernia or abnormality. No abdominopelvic
ascites.

Musculoskeletal: Superior endplate fracture of L3 is favored to be
chronic. No suspicious lytic or blastic lesions.
IMPRESSION: 1. Fat deposition within the wall of the ascending and proximal
transverse colon is consistent with chronic inflammatory changes. No
evidence for acute inflammation/obstruction.
2. Coarse calcification within the RIGHT ovary, likely representing
intra ovarian dermoid.
3. Small hiatal hernia.
4. Normal appendix.
5. Chronic superior endplate fracture of L3.

## 2019-06-10 IMAGING — DX DG ANKLE PORT 2V*R*
1 series · 3 of 3 positions shown · non-contrast
Comparison: Right ankle series of August 24, 2018

CLINICAL DATA: Generalized right ankle pain following a fall

EXAM:
PORTABLE RIGHT ANKLE - 2 VIEW

[Series 1: ankle · 0.14mm/px · 3 of 3 slices shown]
[im 1/3]
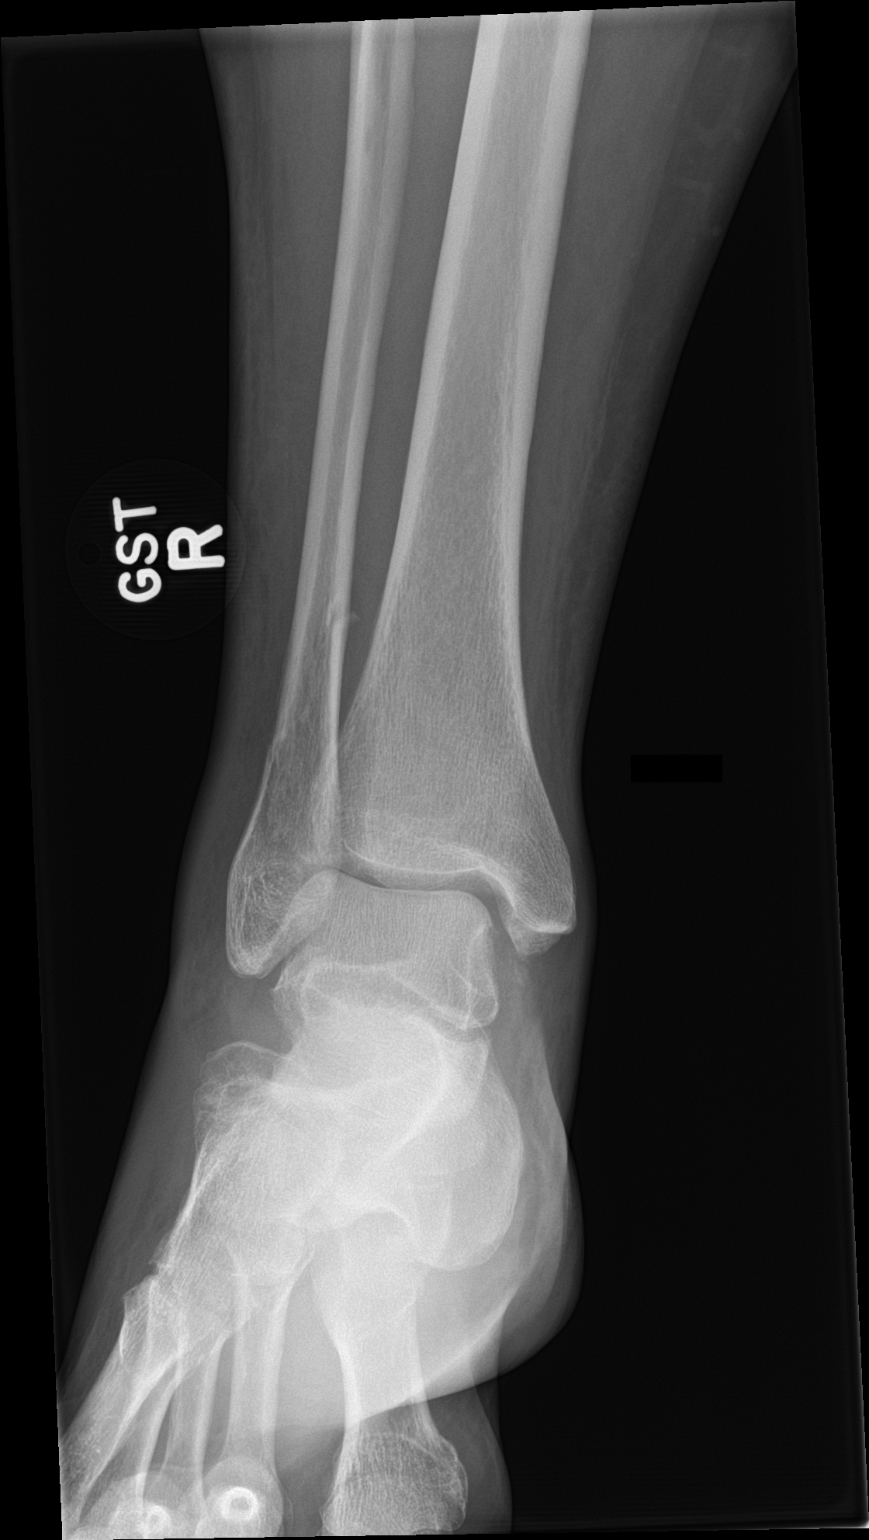
[im 2/3]
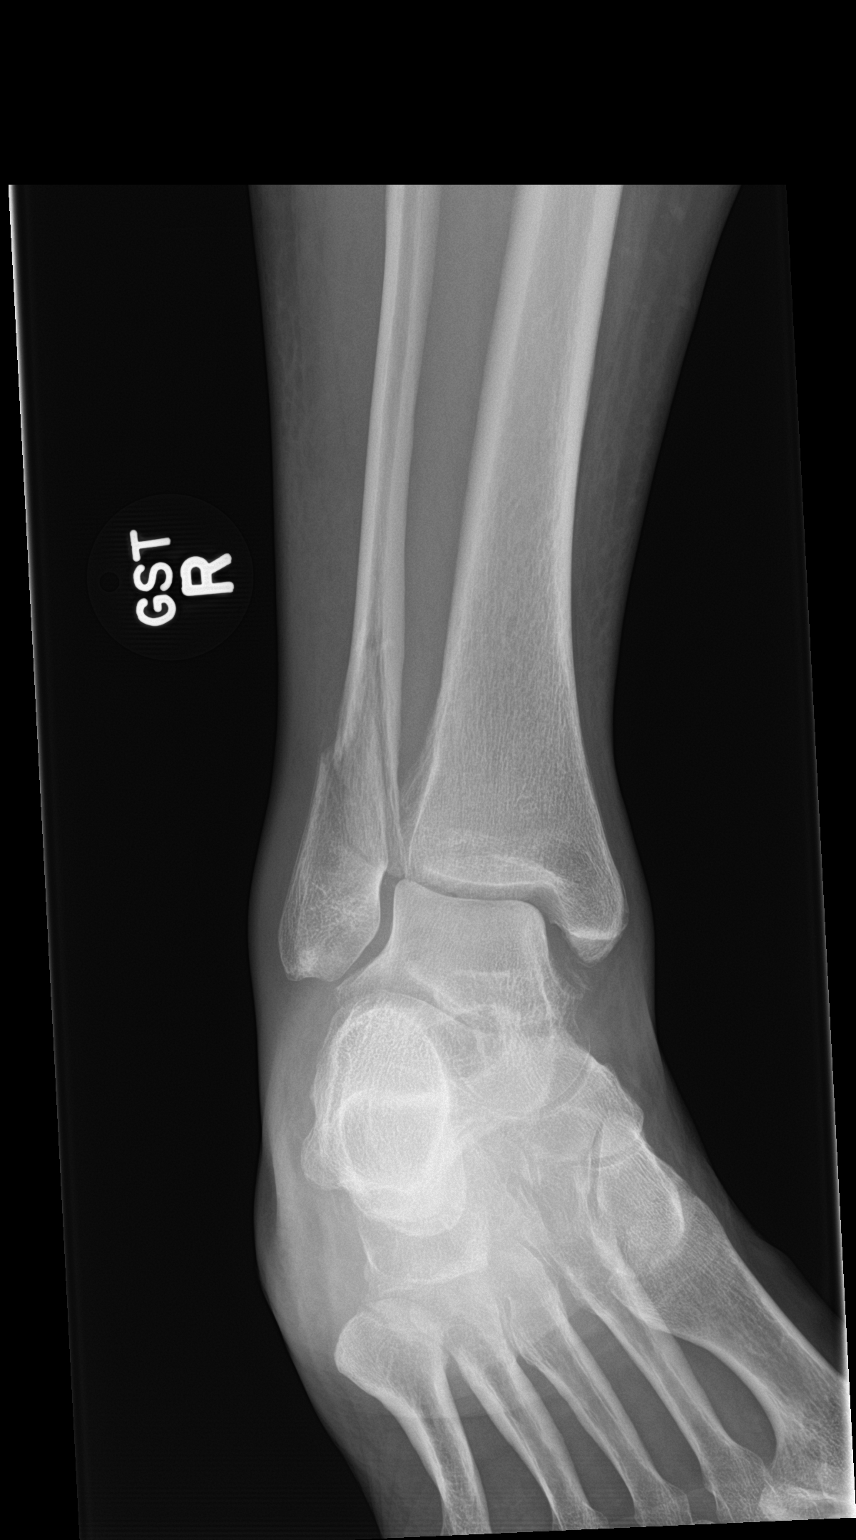
[im 3/3]
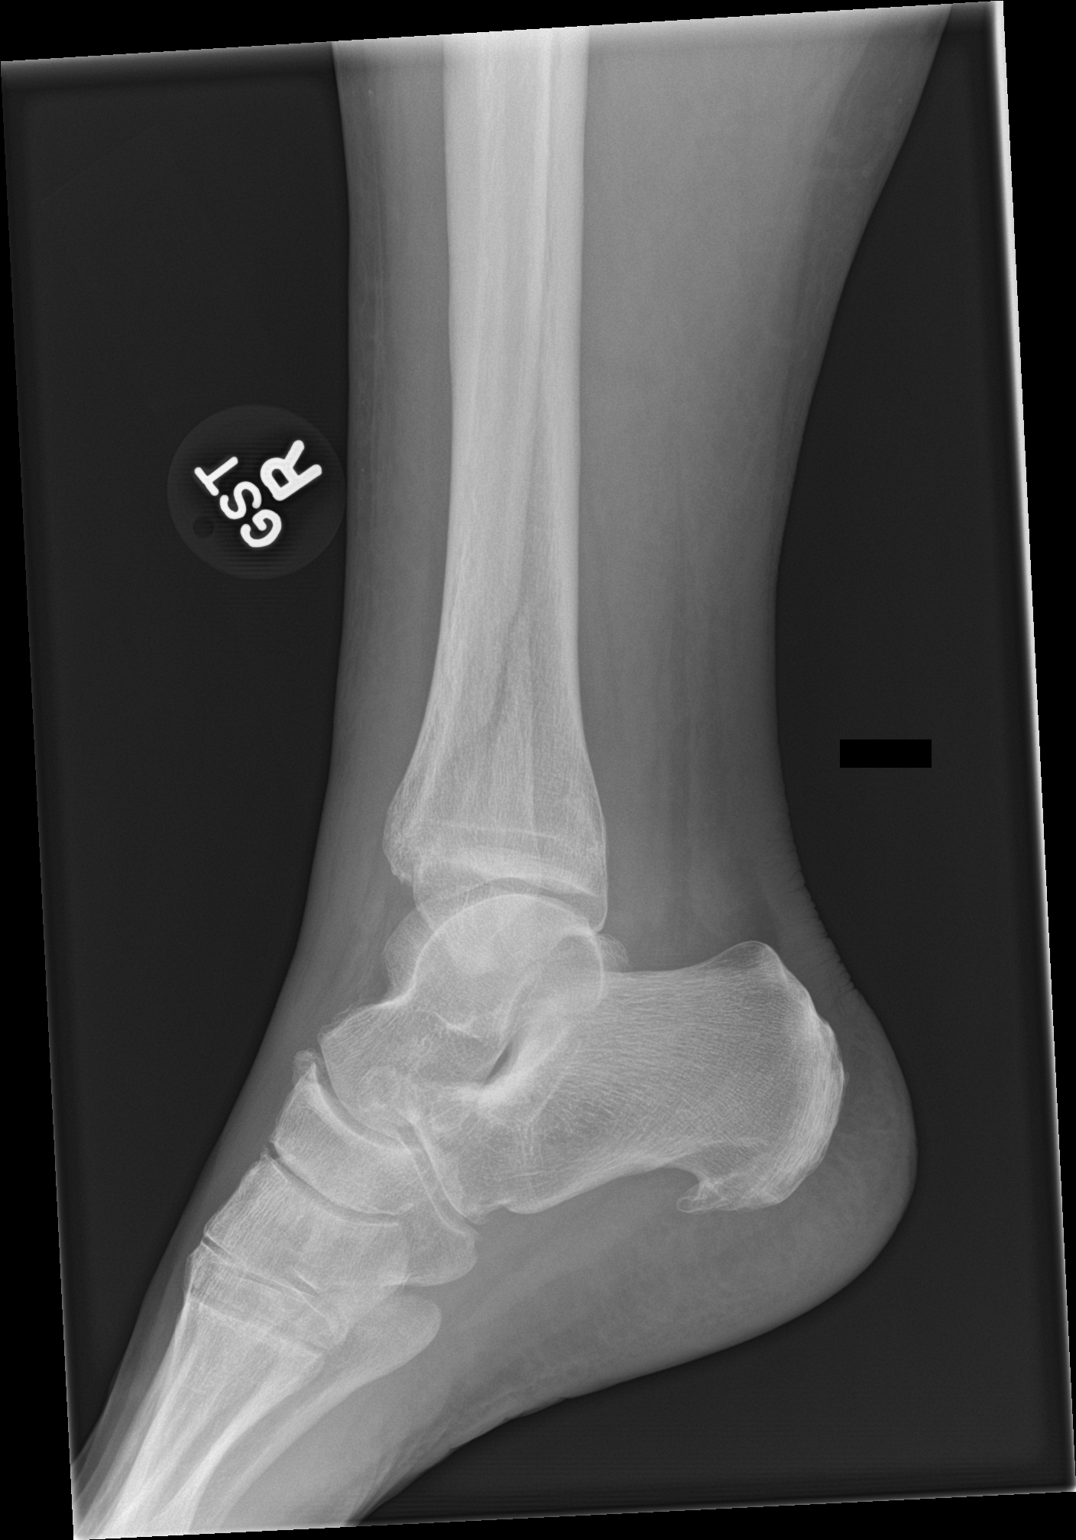

[3 of 3 positions shown; findings below may reference images not displayed]

FINDINGS: Again demonstrated is a mildly displaced spiral fracture of the
distal fibular metadiaphysis. There is mild widening of the ankle
joint mortise. There is subtle bony density inferior to the tip of
the medial malleolus. There is a vertically oriented lucent line
extending from the metaphysis of the distal tibia nearly to the
joint surface. The talus and calcaneus appear intact. There is a
plantar calcaneal spur. A small spur arises from the superior aspect
of the proximal navicular.
IMPRESSION: Known mildly displaced spiral fracture of the distal right fibular
metadiaphysis.

Possible nondisplaced vertically oriented fracture through the
distal tibial metaphysis extending to the tibial plafond.

Probable tiny avulsion from the tip of the medial malleolus or the
medial aspect of the talus.
